# Patient Record
Sex: Female | Born: 1980 | Race: Black or African American | Hispanic: No | Marital: Single | Smoking: Former smoker
Health system: Southern US, Community
[De-identification: ages and names within clinical notes are randomized; demographics above are authoritative.]

## PROBLEM LIST (undated history)

## (undated) ENCOUNTER — Ambulatory Visit: Admission: EM

## (undated) DIAGNOSIS — E049 Nontoxic goiter, unspecified: Secondary | ICD-10-CM

## (undated) DIAGNOSIS — M5481 Occipital neuralgia: Secondary | ICD-10-CM

## (undated) DIAGNOSIS — R519 Headache, unspecified: Secondary | ICD-10-CM

## (undated) DIAGNOSIS — D179 Benign lipomatous neoplasm, unspecified: Secondary | ICD-10-CM

## (undated) DIAGNOSIS — R51 Headache: Secondary | ICD-10-CM

## (undated) DIAGNOSIS — M797 Fibromyalgia: Secondary | ICD-10-CM

## (undated) DIAGNOSIS — G935 Compression of brain: Secondary | ICD-10-CM

## (undated) DIAGNOSIS — D649 Anemia, unspecified: Secondary | ICD-10-CM

## (undated) DIAGNOSIS — N879 Dysplasia of cervix uteri, unspecified: Secondary | ICD-10-CM

## (undated) HISTORY — PX: THYROIDECTOMY: SHX17

---

## 2006-08-28 ENCOUNTER — Emergency Department: Payer: Self-pay | Admitting: Emergency Medicine

## 2006-10-08 LAB — HM PAP SMEAR: HM Pap smear: NEGATIVE

## 2007-12-24 ENCOUNTER — Ambulatory Visit: Payer: Self-pay | Admitting: Family Medicine

## 2008-02-09 ENCOUNTER — Observation Stay: Payer: Self-pay | Admitting: Certified Nurse Midwife

## 2008-03-27 ENCOUNTER — Observation Stay: Payer: Self-pay | Admitting: Obstetrics and Gynecology

## 2008-04-23 ENCOUNTER — Observation Stay: Payer: Self-pay

## 2008-05-10 ENCOUNTER — Emergency Department: Payer: Self-pay | Admitting: Emergency Medicine

## 2008-05-10 ENCOUNTER — Other Ambulatory Visit: Payer: Self-pay

## 2008-05-18 ENCOUNTER — Observation Stay: Payer: Self-pay | Admitting: Obstetrics and Gynecology

## 2008-05-29 ENCOUNTER — Observation Stay: Payer: Self-pay

## 2008-05-31 ENCOUNTER — Inpatient Hospital Stay: Payer: Self-pay | Admitting: Obstetrics and Gynecology

## 2008-05-31 ENCOUNTER — Observation Stay: Payer: Self-pay | Admitting: Certified Nurse Midwife

## 2008-08-22 ENCOUNTER — Emergency Department: Payer: Self-pay | Admitting: Internal Medicine

## 2008-09-27 ENCOUNTER — Emergency Department: Payer: Self-pay | Admitting: Emergency Medicine

## 2009-08-09 ENCOUNTER — Emergency Department: Payer: Self-pay | Admitting: Unknown Physician Specialty

## 2009-08-17 ENCOUNTER — Ambulatory Visit: Payer: Self-pay | Admitting: Certified Nurse Midwife

## 2009-09-04 ENCOUNTER — Ambulatory Visit: Payer: Self-pay | Admitting: Certified Nurse Midwife

## 2009-09-16 ENCOUNTER — Emergency Department: Payer: Self-pay | Admitting: Emergency Medicine

## 2009-10-22 ENCOUNTER — Emergency Department: Payer: Self-pay | Admitting: Emergency Medicine

## 2009-10-25 ENCOUNTER — Ambulatory Visit: Payer: Self-pay | Admitting: Family Medicine

## 2010-07-03 ENCOUNTER — Ambulatory Visit: Payer: Self-pay | Admitting: Internal Medicine

## 2010-09-27 ENCOUNTER — Ambulatory Visit: Payer: Self-pay | Admitting: Family Medicine

## 2010-11-03 ENCOUNTER — Emergency Department (HOSPITAL_COMMUNITY)
Admission: EM | Admit: 2010-11-03 | Discharge: 2010-11-03 | Disposition: A | Discharge status: Home / Self Care | Payer: Medicaid Other | Attending: Emergency Medicine | Admitting: Emergency Medicine

## 2010-11-03 DIAGNOSIS — N898 Other specified noninflammatory disorders of vagina: Secondary | ICD-10-CM | POA: Insufficient documentation

## 2010-11-03 DIAGNOSIS — J45909 Unspecified asthma, uncomplicated: Secondary | ICD-10-CM | POA: Insufficient documentation

## 2010-11-03 DIAGNOSIS — G935 Compression of brain: Secondary | ICD-10-CM | POA: Insufficient documentation

## 2010-11-03 DIAGNOSIS — R51 Headache: Secondary | ICD-10-CM | POA: Insufficient documentation

## 2010-11-03 LAB — WET PREP, GENITAL
Clue Cells Wet Prep HPF POC: NONE SEEN
Trich, Wet Prep: NONE SEEN

## 2010-11-03 LAB — URINALYSIS, ROUTINE W REFLEX MICROSCOPIC
Glucose, UA: NEGATIVE mg/dL
Ketones, ur: NEGATIVE mg/dL
Protein, ur: 30 mg/dL — AB
Urobilinogen, UA: 1 mg/dL (ref 0.0–1.0)

## 2010-11-03 LAB — URINE MICROSCOPIC-ADD ON

## 2010-11-08 ENCOUNTER — Ambulatory Visit: Payer: Self-pay | Admitting: Family Medicine

## 2010-11-15 ENCOUNTER — Emergency Department (HOSPITAL_COMMUNITY)
Admission: EM | Admit: 2010-11-15 | Discharge: 2010-11-16 | Disposition: A | Discharge status: Home / Self Care | Payer: Medicaid Other | Attending: Emergency Medicine | Admitting: Emergency Medicine

## 2010-11-15 DIAGNOSIS — R51 Headache: Secondary | ICD-10-CM | POA: Insufficient documentation

## 2010-11-15 DIAGNOSIS — IMO0001 Reserved for inherently not codable concepts without codable children: Secondary | ICD-10-CM | POA: Insufficient documentation

## 2010-11-15 DIAGNOSIS — R5381 Other malaise: Secondary | ICD-10-CM | POA: Insufficient documentation

## 2010-11-15 DIAGNOSIS — N390 Urinary tract infection, site not specified: Secondary | ICD-10-CM | POA: Insufficient documentation

## 2010-11-15 DIAGNOSIS — J45909 Unspecified asthma, uncomplicated: Secondary | ICD-10-CM | POA: Insufficient documentation

## 2010-11-16 LAB — BASIC METABOLIC PANEL
Chloride: 104 mEq/L (ref 96–112)
Creatinine, Ser: 0.88 mg/dL (ref 0.4–1.2)
GFR calc Af Amer: >60 mL/min (ref >60)
Potassium: 4 mEq/L (ref 3.5–5.1)

## 2010-11-16 LAB — CBC
HCT: 35.3 % — ABNORMAL LOW (ref 36.0–46.0)
MCH: 31.9 pg (ref 26.0–34.0)
MCHC: 33.1 g/dL (ref 30.0–36.0)
MCV: 96.2 fL (ref 78.0–100.0)
RDW: 13.2 % (ref 11.5–15.5)

## 2010-11-16 LAB — URINE MICROSCOPIC-ADD ON

## 2010-11-16 LAB — URINALYSIS, ROUTINE W REFLEX MICROSCOPIC
Bilirubin Urine: NEGATIVE
Hgb urine dipstick: NEGATIVE
Protein, ur: NEGATIVE mg/dL
Urobilinogen, UA: 0.2 mg/dL (ref 0.0–1.0)

## 2010-11-16 LAB — DIFFERENTIAL
Eosinophils Relative: 1 % (ref 0–5)
Lymphocytes Relative: 46 % (ref 12–46)
Lymphs Abs: 2.7 10*3/uL (ref 0.7–4.0)
Monocytes Absolute: 0.4 10*3/uL (ref 0.1–1.0)

## 2010-11-16 LAB — CK: Total CK: 58 U/L (ref 7–177)

## 2010-12-17 ENCOUNTER — Ambulatory Visit: Payer: Self-pay | Admitting: Family Medicine

## 2010-12-28 ENCOUNTER — Emergency Department: Payer: Self-pay | Admitting: Unknown Physician Specialty

## 2011-02-15 ENCOUNTER — Ambulatory Visit: Payer: Self-pay | Admitting: Family Medicine

## 2011-09-26 ENCOUNTER — Ambulatory Visit: Payer: Self-pay | Admitting: Family Medicine

## 2012-02-26 ENCOUNTER — Observation Stay: Payer: Self-pay

## 2012-03-05 ENCOUNTER — Observation Stay: Payer: Self-pay

## 2012-03-09 ENCOUNTER — Observation Stay: Payer: Self-pay | Admitting: Obstetrics and Gynecology

## 2012-03-12 ENCOUNTER — Encounter: Payer: Self-pay | Admitting: Maternal and Fetal Medicine

## 2012-03-13 ENCOUNTER — Inpatient Hospital Stay: Payer: Self-pay | Admitting: Obstetrics and Gynecology

## 2012-03-13 LAB — CBC WITH DIFFERENTIAL/PLATELET
Basophil %: 0.2 %
Eosinophil #: 0.1 10*3/uL (ref 0.0–0.7)
Eosinophil %: 1 %
HCT: 37.3 % (ref 35.0–47.0)
HGB: 12.4 g/dL (ref 12.0–16.0)
Lymphocyte %: 15.6 %
MCV: 101 fL — ABNORMAL HIGH (ref 80–100)
Monocyte #: 0.7 x10 3/mm (ref 0.2–0.9)
Monocyte %: 6.7 %
Neutrophil #: 7.5 10*3/uL — ABNORMAL HIGH (ref 1.4–6.5)
RBC: 3.7 10*6/uL — ABNORMAL LOW (ref 3.80–5.20)
WBC: 9.8 10*3/uL (ref 3.6–11.0)

## 2012-03-13 LAB — TSH: Thyroid Stimulating Horm: 2.8 u[IU]/mL

## 2012-03-17 LAB — PATHOLOGY REPORT

## 2012-11-24 ENCOUNTER — Ambulatory Visit: Payer: Self-pay | Admitting: Family Medicine

## 2013-01-04 ENCOUNTER — Ambulatory Visit: Payer: Self-pay | Admitting: Family Medicine

## 2013-01-04 LAB — HCG, QUANTITATIVE, PREGNANCY: Beta Hcg, Quant.: <1 m[IU]/mL — ABNORMAL LOW

## 2013-01-15 ENCOUNTER — Telehealth: Payer: Self-pay | Admitting: Diagnostic Neuroimaging

## 2013-01-18 NOTE — Telephone Encounter (Signed)
Pt says she is in pain. All over body, including chest. 9 on pain scale. Says she was told by pain specialist may be nerve related pain. Does not want to go to ER. Having vision changes also. Will p/u pain meds from pain clinic on 01/29/13. Has eye exam appt on 02/02/13. Will follow up with pain clinic in office on 02/25/13 to see if pain meds are working. Would like to know if she should go forward w/ LP as suggested in last OV.

## 2013-01-27 NOTE — Telephone Encounter (Signed)
I called pt and relayed that per Dr. Richrd Humbles note to have pain clinic appt and eye exam appt first and the he will review records.  Told her to have them fax results to [place].   She said she would.

## 2013-01-27 NOTE — Telephone Encounter (Signed)
No LP. Should go to pain clinic, eye clinic first, and then I will review results. -VRP

## 2013-06-03 ENCOUNTER — Ambulatory Visit: Payer: Self-pay | Admitting: Family Medicine

## 2013-07-14 ENCOUNTER — Ambulatory Visit: Payer: Self-pay | Admitting: Family Medicine

## 2013-09-13 ENCOUNTER — Ambulatory Visit: Payer: Self-pay | Admitting: Gastroenterology

## 2014-05-10 ENCOUNTER — Ambulatory Visit: Payer: Self-pay | Admitting: Neurology

## 2014-06-07 DIAGNOSIS — G935 Compression of brain: Secondary | ICD-10-CM | POA: Insufficient documentation

## 2014-09-20 ENCOUNTER — Ambulatory Visit: Payer: Self-pay | Admitting: Family Medicine

## 2014-10-06 ENCOUNTER — Ambulatory Visit: Payer: Self-pay | Admitting: Family Medicine

## 2014-10-06 HISTORY — PX: BREAST BIOPSY: SHX20

## 2014-12-20 NOTE — Op Note (Signed)
PATIENT NAME:  Kelli Richard, Kelli Richard MR#:  [id number] DATE OF BIRTH:  February 17, 1981  DATE OF PROCEDURE:  03/13/2012  PREOPERATIVE DIAGNOSES: 1. Postdates pregnancy. 2. Oligohydramnios. 3. History of Chiari malformation and occipital lipoma. 4. Worsening headaches. 5. Elective permanent sterilization.   POSTOPERATIVE DIAGNOSES: 1. Postdates pregnancy. 2. Oligohydramnios. 3. History of Chiari malformation and occipital lipoma. 4. Worsening headaches. 5. Elective permanent sterilization.   PROCEDURE:  1. Primary low transverse cesarean section.  2. Bilateral tubal ligation, [place].  3. On-Q pump placement.   SURGEON: Boykin Nearing, M.D.  FIRST ASSISTANT:  A. Harriet Pho, CNM   ANESTHESIA:  Spinal.  INDICATION: This is a 34 year old gravida 3, para 2 patient with a history of Arnold-Chiari malformation and occipital lipoma. The patient had previously been worked up by neurology but no recent work-up. The patient has increasing headaches. The patient is 41 weeks and at risk for complications if she is allowed to labor based on her central nervous system symptoms and prior anatomic history.  I spoke to the patient regarding a primary low transverse cesarean section and tubal ligation. The patient agrees to the procedure. The patient reconfirms desire for permanent sterilization.   PROCEDURE: After adequate spinal anesthesia, the patient was placed in the dorsal supine position with a hip roll under the right side. The patient's abdomen was prepped and draped in normal sterile fashion. A Pfannenstiel incision was made two fingerbreadths above the symphysis pubis. Sharp dissection was used to identify the fascia. The fascia was opened in the midline and opened in a transverse fashion. The superior aspect of the fascia was grasped with Kocher clamps and the recti muscles dissected free. The inferior aspect of the fascia was grasped with Kocher clamps. Pyramidalis muscle was dissected free.  Entry into the peritoneal cavity was accomplished sharply. The vesicouterine peritoneal fold was identified and opened and a bladder flap was created, and the bladder was reflected inferiorly. A low transverse uterine incision was made. Upon entry into the endometrial cavity, clear fluid resulted. The fetal head was brought to the incision, vacuum applied to the occiput. One gentle pull allowed for delivery of the head. Shoulders and body were delivered without difficulty. The placenta was manually delivered and the uterus was exteriorized. The endometrial cavity was wiped clean with laparotomy tape. A ring forceps was used to open the cervix and this was passed off the operative field. The uterine incision was closed with 1 chromic suture in a running locking fashion with good hemostasis and good approximation of tissue. Attention was then directed to the patient's right fallopian tube which was grasped at the midportion of the fallopian tube. Two separate 0 plain gut sutures were applied and a 1.5-cm portion of the fallopian tube removed. A similar procedure was repeated on the patient's left fallopian tube. After placing 2-0 plain gut sutures, a 1.5-cm  portion of the fallopian tube was removed. Good hemostasis was noted. Ovaries appeared normal. The posterior cul-de-sac appeared normal. The uterus was placed back into the abdominal cavity. The paracolic gutters were wiped clean with a laparotomy tape. Good hemostasis was noted at the uterine incision. Interceed was placed on the incision in a T-shaped fashion. The On-Q pump system was placed infraumbilically and the catheters were placed subfascially. The fascia was closed over this with 0 Vicryl suture. The fascia was reapproximated in normal fashion. The subcutaneous tissues were irrigated and bovied for hemostasis and the skin was reapproximated with staples. The On-Q pump catheters were secured in standard  fashion and were infused with 5 mL of 0.5% Marcaine.  The patient did receive 2 grams of  IV Ancef prior to commencement of the case. There were no complications. The patient tolerated the procedure well. Estimated blood loss was 600 mL. Intraoperative fluids were 900 mL. The patient was taken to the recovery room in good condition.    ____________________________ Boykin Nearing, MD tjs:bjt D: 03/13/2012 16:12:37 ET T: 03/13/2012 16:37:24 ET JOB#: [id number]  Boykin Nearing MD ELECTRONICALLY SIGNED 03/16/2012 8:45

## 2014-12-20 NOTE — Discharge Summary (Signed)
PATIENT NAME:  Kelli Richard, Kelli Richard MR#:  [id number] DATE OF BIRTH:  10/31/1980  DATE OF ADMISSION:  03/13/2012 DATE OF DISCHARGE:  03/16/2012  PRINCIPAL PROCEDURE: Primary low transverse Cesarean section.   HOSPITAL COURSE: The patient underwent the above procedure for history of Chiari malformation and headaches in pregnancy and documented intracranial lesion. Also, the patient was diagnosed with oligohydramnios. The patient tolerated the procedure well. Postop day #1 hematocrit was 31.9%. She was discharged to home in good condition and will follow-up with Dr. Ouida Sills in two weeks for wound care or before if she has wound drainage, fever, nausea, or vomiting.   ____________________________ Boykin Nearing, MD tjs:drc D: 03/23/2012 09:19:33 ET T: 03/23/2012 14:46:47 ET JOB#: [id number]  cc: Boykin Nearing, MD, <Dictator> Boykin Nearing MD ELECTRONICALLY SIGNED 03/26/2012 10:43

## 2014-12-25 NOTE — Consult Note (Signed)
Referral Information:   Reason for Referral 41 0/7 weeks -- scheduled for labor induction tomorrow. Patient with severe headaches; she is known to have a Chiari Malformation and an occipital lipoma.   Home Medications: Medication Instructions Status  Prenatal Multivitamins oral tablet 1 tab(s) orally once a day Active  Carafate 1 g/10 mL oral suspension 2 teaspoonfulls before each meal,for Indigestion, Heartburn Active  omeprazole 20 mg oral delayed release tablet 2 tab(s) orally 2 times a day Active  fluticasone 50 mcg/inh nasal spray 1 spray(s) nasal once a day Active  Deep Sea Nasal 0.65% nasal spray 2 spray(s) nasal 4 times a day Active  ferrous sulfate 325 mg (65 mg elemental iron) oral tablet 1 tab(s) orally 2 times a day Active   Allergies:   No Known Allergies:   Vital Signs/Notes:  Nursing Vital Signs: **Vital Signs.:   11-Jul-13 10:35   Vital Signs Type Routine   Temperature Temperature (F) 97.8   Celsius 36.5   Temperature Source oral   Pulse Pulse 91   Pulse source if not from Vital Sign Device dinamap   Respirations Respirations 16   Systolic BP Systolic BP 010   Diastolic BP (mmHg) Diastolic BP (mmHg) 66   Mean BP 84   BP Source  if not from Vital Sign Device dinamap   Impression/Recommendations:   Impression When speaking with the patient, I learned that she cannot describe with accuracy much about her disease/lesion. She does know that sometime in 2012 she had a work up for her headaches that showed the Chiari malformation and the lipoma. She does not know what if any medicine or treatment was recommended. She does not know if she was given any precautions. I have no access to her medical records concerning this.    Recommendations Before delivery is attempted obtain head MRI and get neurology consultation.     Total Time Spent with Patient 15 minutes    >50% of visit spent in couseling/coordination of care yes    Office Use Only 99241  Level 1 (7min) NEW  office consult prob focused   Coding Description: MATERNAL CONDITIONS/HISTORY INDICATION(S).   OTHER: Chiari malformation and occiptal lipoma.  Electronic Signatures: Sande Rives (MD)  (Signed 11-Jul-13 11:10)  Authored: Referral, Home Medications, Allergies, Vital Signs/Notes, Impression, Billing, Coding Description   Last Updated: 11-Jul-13 11:10 by Sande Rives (MD)

## 2014-12-26 LAB — SURGICAL PATHOLOGY

## 2015-06-07 ENCOUNTER — Emergency Department
Admission: EM | Admit: 2015-06-07 | Discharge: 2015-06-07 | Disposition: A | Discharge status: Home / Self Care | Payer: Medicaid Other | Attending: Emergency Medicine | Admitting: Emergency Medicine

## 2015-06-07 ENCOUNTER — Encounter: Payer: Self-pay | Admitting: Emergency Medicine

## 2015-06-07 DIAGNOSIS — S199XXA Unspecified injury of neck, initial encounter: Secondary | ICD-10-CM | POA: Diagnosis present

## 2015-06-07 DIAGNOSIS — S39012A Strain of muscle, fascia and tendon of lower back, initial encounter: Secondary | ICD-10-CM | POA: Insufficient documentation

## 2015-06-07 DIAGNOSIS — Y998 Other external cause status: Secondary | ICD-10-CM | POA: Insufficient documentation

## 2015-06-07 DIAGNOSIS — Y9389 Activity, other specified: Secondary | ICD-10-CM | POA: Insufficient documentation

## 2015-06-07 DIAGNOSIS — S161XXA Strain of muscle, fascia and tendon at neck level, initial encounter: Secondary | ICD-10-CM | POA: Diagnosis not present

## 2015-06-07 DIAGNOSIS — Y92481 Parking lot as the place of occurrence of the external cause: Secondary | ICD-10-CM | POA: Diagnosis not present

## 2015-06-07 MED ORDER — CYCLOBENZAPRINE HCL 10 MG PO TABS: 10.0000 mg | ORAL_TABLET | Freq: Three times a day (TID) | ORAL | Status: DC | PRN

## 2015-06-07 MED ORDER — OXYCODONE-ACETAMINOPHEN 7.5-325 MG PO TABS: 1.0000 | ORAL_TABLET | Freq: Four times a day (QID) | ORAL | Status: DC | PRN

## 2015-06-07 NOTE — ED Notes (Signed)
AAOx3.  Skin warm and dry.  Moving all extremities equally and strong. Ambulates with easy and steady gait.  Posture relaxed.
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
## 2015-09-14 ENCOUNTER — Other Ambulatory Visit: Payer: Self-pay | Admitting: Family Medicine

## 2015-09-14 ENCOUNTER — Ambulatory Visit
Admission: RE | Admit: 2015-09-14 | Discharge: 2015-09-14 | Disposition: A | Discharge status: Home / Self Care | Payer: Medicaid Other | Source: Ambulatory Visit | Attending: Family Medicine | Admitting: Family Medicine

## 2015-09-14 DIAGNOSIS — M545 Low back pain: Secondary | ICD-10-CM | POA: Insufficient documentation

## 2015-09-20 ENCOUNTER — Other Ambulatory Visit: Payer: Self-pay | Admitting: Family Medicine

## 2015-09-20 DIAGNOSIS — N63 Unspecified lump in unspecified breast: Secondary | ICD-10-CM

## 2015-10-03 ENCOUNTER — Ambulatory Visit: Payer: No Typology Code available for payment source

## 2015-10-03 ENCOUNTER — Other Ambulatory Visit: Payer: Medicaid Other

## 2015-10-12 ENCOUNTER — Other Ambulatory Visit: Payer: Medicaid Other

## 2015-10-12 ENCOUNTER — Ambulatory Visit: Payer: No Typology Code available for payment source

## 2015-10-19 ENCOUNTER — Ambulatory Visit: Payer: No Typology Code available for payment source

## 2015-10-19 ENCOUNTER — Other Ambulatory Visit: Payer: Medicaid Other

## 2015-10-24 ENCOUNTER — Ambulatory Visit
Admission: RE | Admit: 2015-10-24 | Discharge: 2015-10-24 | Disposition: A | Discharge status: Home / Self Care | Payer: Medicaid Other | Source: Ambulatory Visit | Attending: Family Medicine | Admitting: Family Medicine

## 2015-10-24 ENCOUNTER — Other Ambulatory Visit: Payer: Self-pay | Admitting: Family Medicine

## 2015-10-24 DIAGNOSIS — N63 Unspecified lump in unspecified breast: Secondary | ICD-10-CM

## 2015-10-26 ENCOUNTER — Other Ambulatory Visit: Payer: Self-pay | Admitting: Family Medicine

## 2015-10-26 DIAGNOSIS — N63 Unspecified lump in unspecified breast: Secondary | ICD-10-CM

## 2015-11-08 ENCOUNTER — Ambulatory Visit
Admission: RE | Admit: 2015-11-08 | Discharge: 2015-11-08 | Disposition: A | Discharge status: Home / Self Care | Payer: Medicaid Other | Source: Ambulatory Visit | Attending: Family Medicine | Admitting: Family Medicine

## 2015-11-08 ENCOUNTER — Ambulatory Visit: Payer: No Typology Code available for payment source

## 2015-11-08 DIAGNOSIS — N63 Unspecified lump in unspecified breast: Secondary | ICD-10-CM

## 2015-11-08 HISTORY — PX: BREAST BIOPSY: SHX20

## 2015-11-10 LAB — SURGICAL PATHOLOGY

## 2016-01-23 ENCOUNTER — Emergency Department
Admission: EM | Admit: 2016-01-23 | Discharge: 2016-01-23 | Disposition: A | Discharge status: Home / Self Care | Payer: Medicaid Other | Attending: Emergency Medicine | Admitting: Emergency Medicine

## 2016-01-23 ENCOUNTER — Encounter: Payer: Self-pay | Admitting: Emergency Medicine

## 2016-01-23 DIAGNOSIS — M797 Fibromyalgia: Secondary | ICD-10-CM | POA: Insufficient documentation

## 2016-01-23 DIAGNOSIS — M79604 Pain in right leg: Secondary | ICD-10-CM | POA: Insufficient documentation

## 2016-01-23 DIAGNOSIS — M79605 Pain in left leg: Secondary | ICD-10-CM | POA: Diagnosis present

## 2016-01-23 HISTORY — DX: Anemia, unspecified: D64.9

## 2016-01-23 HISTORY — DX: Fibromyalgia: M79.7

## 2016-01-23 MED ORDER — METHYLPREDNISOLONE 4 MG PO TBPK: ORAL_TABLET | ORAL | Status: DC

## 2016-01-23 MED ORDER — TRAMADOL HCL 50 MG PO TABS: 50.0000 mg | ORAL_TABLET | Freq: Four times a day (QID) | ORAL | Status: AC | PRN
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
## 2016-01-23 NOTE — ED Notes (Addendum)
Pt reports dx of fibromyalgia and is having pain all over but worse in her back/arms/legs since yesterday - pt reports that she has been working full time and she thinks that working is causing the pain to increase

## 2016-01-23 NOTE — ED Provider Notes (Signed)
[facility] Emergency Department Provider Note   ____________________________________________  Time seen: Approximately 9:41 PM  I have reviewed the triage vital signs and the nursing notes.   HISTORY  Chief Complaint Back Pain and Leg Pain    HPI Kelli Richard is a 35 y.o. female patient complain pain secondary to her fibromyalgia. Patient states she's been working more usual and believes this is increase in her pain. Patient stated the pain is diffuse but worse in her lower extremities. Patient states she is has not been evaluated by pain management and requests a consult. Patient states she is seen by orthopedics with no definitive relief. Patient rating the pain as a 10 over 10. No palliative measures taken for this complaint.   Past Medical History  Diagnosis Date  . Fibromyalgia   . Anemia     There are no active problems to display for this patient.   Past Surgical History  Procedure Laterality Date  . Breast biopsy Right 10/06/2014    [place] core biopsy  . Thyroidectomy      Current Outpatient Rx  Name  Route  Sig  Dispense  Refill  . cyclobenzaprine (FLEXERIL) 10 MG tablet   Oral   Take 1 tablet (10 mg total) by mouth every 8 (eight) hours as needed for muscle spasms.   15 tablet   0   . methylPREDNISolone (MEDROL DOSEPAK) 4 MG TBPK tablet      Take Tapered dose as directed   21 tablet   0   . oxyCODONE-acetaminophen (PERCOCET) 7.5-325 MG tablet   Oral   Take 1 tablet by mouth every 6 (six) hours as needed for severe pain.   12 tablet   0   . traMADol (ULTRAM) 50 MG tablet   Oral   Take 1 tablet (50 mg total) by mouth every 6 (six) hours as needed.   20 tablet   0     Allergies Review of patient's allergies indicates no known allergies.  History reviewed. No pertinent family history.  Social History Social History  Substance Use Topics  . Smoking status: Never Smoker   . Smokeless tobacco: None  . Alcohol  Use: No    Review of Systems Constitutional: No fever/chills Eyes: No visual changes. ENT: No sore throat. Cardiovascular: Denies chest pain. Respiratory: Denies shortness of breath. Gastrointestinal: No abdominal pain.  No nausea, no vomiting.  No diarrhea.  No constipation. Genitourinary: Negative for dysuria. Musculoskeletal: Negative for back pain. Skin: Negative for rash. Neurological: Negative for headaches, focal weakness or numbness. Hematological/Lymphatic:Anemia ____________________________________________   PHYSICAL EXAM:  VITAL SIGNS: ED Triage Vitals  Enc Vitals Group     BP 01/23/16 2051 116/65 mmHg     Pulse --      Resp 01/23/16 2051 18     Temp 01/23/16 2051 98.2 F (36.8 C)     Temp Source 01/23/16 2051 Oral     SpO2 01/23/16 2051 99 %     Weight 01/23/16 2051 198 lb (89.812 kg)     Height 01/23/16 2051 5\' 6"  (1.676 m)     Head Cir --      Peak Flow --      Pain Score 01/23/16 2053 10     Pain Loc --      Pain Edu? --      Excl. in [place]? --     Constitutional: Alert and oriented. Well appearing and in no acute distress. Eyes: Conjunctivae are normal. PERRL.  EOMI. Head: Atraumatic. Nose: No congestion/rhinnorhea. Mouth/Throat: Mucous membranes are moist.  Oropharynx non-erythematous. Neck: No stridor. No cervical spine tenderness to palpation. Cardiovascular: Normal rate, regular rhythm. Grossly normal heart sounds.  Good peripheral circulation. Respiratory: Normal respiratory effort.  No retractions. Lungs CTAB. Gastrointestinal: Soft and nontender. No distention. No abdominal bruits. No CVA tenderness. Musculoskeletal: Bilateral  upper and lower  extremity guarding with palpation. Neurologic:  Normal speech and language. No gross focal neurologic deficits are appreciated. No gait instability. Skin:  Skin is warm, dry and intact. No rash noted. Psychiatric: Mood and affect are normal. Speech and behavior are  normal.  ____________________________________________   LABS (all labs ordered are listed, but only abnormal results are displayed)  Labs Reviewed - No data to display ____________________________________________  EKG   ____________________________________________  RADIOLOGY    PROCEDURES  Procedure(s) performed: None  Critical Care performed: No  ____________________________________________   INITIAL IMPRESSION / ASSESSMENT AND PLAN / ED COURSE  Pertinent labs & imaging results that were available during my care of the patient were reviewed by me and considered in my medical decision making (see chart for details).  Myalgia and arthralgia. Patient given discharge Instructions. Patient advised to follow-up with the pain management clinic for definitive evaluation and treatment. Patient given a work note for one day. Patient given a prescription for prednisone and tramadol. ____________________________________________   FINAL CLINICAL IMPRESSION(S) / ED DIAGNOSES  Final diagnoses:  Fibromyalgia affecting multiple sites      NEW MEDICATIONS STARTED DURING THIS VISIT:  New Prescriptions   METHYLPREDNISOLONE (MEDROL DOSEPAK) 4 MG TBPK TABLET    Take Tapered dose as directed   TRAMADOL (ULTRAM) 50 MG TABLET    Take 1 tablet (50 mg total) by mouth every 6 (six) hours as needed.     Note:  This document was prepared using Dragon voice recognition software and may include unintentional dictation errors.    Sable Feil, PA-C 01/23/16 2153  Harvest Dark, MD 01/23/16 [phone]

## 2016-01-23 NOTE — Discharge Instructions (Signed)
Myofascial Pain Syndrome and Fibromyalgia  Myofascial pain syndrome and fibromyalgia are both pain disorders. This pain may be felt mainly in your muscles.   · Myofascial pain syndrome:    Always has trigger points or tender points in the muscle that will cause pain when pressed. The pain may come and go.    Usually affects your neck, upper back, and shoulder areas. The pain often radiates into your arms and hands.  · Fibromyalgia:    Has muscle pains and tenderness that come and go.    Is often associated with fatigue and sleep disturbances.    Has trigger points.    Tends to be long-lasting (chronic), but is not life-threatening.  Fibromyalgia and myofascial pain are not the same. However, they often occur together. If you have both conditions, each can make the other worse. Both are common and can cause enough pain and fatigue to make day-to-day activities difficult.   CAUSES   The exact causes of fibromyalgia and myofascial pain are not known. People with certain gene types may be more likely to develop fibromyalgia. Some factors can be triggers for both conditions, such as:   · Spine disorders.  · Arthritis.  · Severe injury (trauma) and other physical stressors.  · Being under a lot of stress.  · A medical illness.  SIGNS AND SYMPTOMS   Fibromyalgia  The main symptom of fibromyalgia is widespread pain and tenderness in your muscles. This can vary over time. Pain is sometimes described as stabbing, shooting, or burning. You may have tingling or numbness, too. You may also have sleep problems and fatigue. You may wake up feeling tired and groggy (fibro fog). Other symptoms may include:   · Bowel and bladder problems.  · Headaches.  · Visual problems.  · Problems with odors and noises.  · Depression or mood changes.  · Painful menstrual periods (dysmenorrhea).  · Dry skin or eyes.  Myofascial pain syndrome  Symptoms of myofascial pain syndrome include:   · Tight, ropy bands of muscle.    · Uncomfortable  sensations in muscular areas, such as:    Aching.    Cramping.    Burning.    Numbness.    Tingling.      Muscle weakness.  · Trouble moving certain muscles freely (range of motion).  DIAGNOSIS   There are no specific tests to diagnose fibromyalgia or myofascial pain syndrome. Both can be hard to diagnose because their symptoms are common in many other conditions. Your health care provider may suspect one or both of these conditions based on your symptoms and medical history. Your health care provider will also do a physical exam.   The key to diagnosing fibromyalgia is having pain, fatigue, and other symptoms for more than three months that cannot be explained by another condition.   The key to diagnosing myofascial pain syndrome is finding trigger points in muscles that are tender and cause pain elsewhere in your body (referred pain).  TREATMENT   Treating fibromyalgia and myofascial pain often requires a team of health care providers. This usually starts with your primary provider and a physical therapist. You may also find it helpful to work with alternative health care providers, such as massage therapists or acupuncturists.  Treatment for fibromyalgia may include medicines. This may include nonsteroidal anti-inflammatory drugs (NSAIDs), along with other medicines.   Treatment for myofascial pain may also include:  · NSAIDs.  · Cooling and stretching of muscles.  · Trigger point injections.  ·   Sound wave (ultrasound) treatments to stimulate muscles.  HOME CARE INSTRUCTIONS   · Take medicines only as directed by your health care provider.  · Exercise as directed by your health care provider or physical therapist.  · Try to avoid stressful situations.  · Practice relaxation techniques to control your stress. You may want to try:    Biofeedback.    Visual imagery.    Hypnosis.    Muscle relaxation.    Yoga.    Meditation.  · Talk to your health care provider about alternative treatments, such as acupuncture or  massage treatment.  · Maintain a healthy lifestyle. This includes eating a healthy diet and getting enough sleep.  · Consider joining a support group.  · Do not do activities that stress or strain your muscles. That includes repetitive motions and heavy lifting.  SEEK MEDICAL CARE IF:   · You have new symptoms.  · Your symptoms get worse.  · You have side effects from your medicines.  · You have trouble sleeping.  · Your condition is causing depression or anxiety.  FOR MORE INFORMATION   · National Fibromyalgia Association: http://www.fmaware.orgwww.fmaware.org  · Arthritis Foundation: http://www.arthritis.orgwww.arthritis.org  · American Chronic Pain Association: http://www.theacpa.org/condition/myofascial-painwww.theacpa.org/condition/myofascial-pain     This information is not intended to replace advice given to you by your health care provider. Make sure you discuss any questions you have with your health care provider.     Document Released: 08/19/2005 Document Revised: 09/09/2014 Document Reviewed: 05/25/2014  Elsevier Interactive Patient Education ©2016 [employer]

## 2016-09-03 ENCOUNTER — Encounter: Payer: Self-pay | Admitting: Medical Oncology

## 2016-09-03 ENCOUNTER — Emergency Department: Payer: Medicaid Other

## 2016-09-03 ENCOUNTER — Emergency Department
Admission: EM | Admit: 2016-09-03 | Discharge: 2016-09-03 | Disposition: A | Discharge status: Home / Self Care | Payer: Medicaid Other | Attending: Emergency Medicine | Admitting: Emergency Medicine

## 2016-09-03 DIAGNOSIS — R51 Headache: Secondary | ICD-10-CM | POA: Diagnosis present

## 2016-09-03 DIAGNOSIS — R110 Nausea: Secondary | ICD-10-CM | POA: Insufficient documentation

## 2016-09-03 DIAGNOSIS — R197 Diarrhea, unspecified: Secondary | ICD-10-CM | POA: Insufficient documentation

## 2016-09-03 DIAGNOSIS — R519 Headache, unspecified: Secondary | ICD-10-CM

## 2016-09-03 LAB — COMPREHENSIVE METABOLIC PANEL
ALT: 11 U/L — AB (ref 14–54)
ANION GAP: 4 — AB (ref 5–15)
AST: 19 U/L (ref 15–41)
Albumin: 4.1 g/dL (ref 3.5–5.0)
Alkaline Phosphatase: 61 U/L (ref 38–126)
BUN: 9 mg/dL (ref 6–20)
CHLORIDE: 106 mmol/L (ref 101–111)
CO2: 28 mmol/L (ref 22–32)
CREATININE: 0.86 mg/dL (ref 0.44–1.00)
Calcium: 9.1 mg/dL (ref 8.9–10.3)
Glucose, Bld: 86 mg/dL (ref 65–99)
POTASSIUM: 3.5 mmol/L (ref 3.5–5.1)
SODIUM: 138 mmol/L (ref 135–145)
Total Bilirubin: 0.6 mg/dL (ref 0.3–1.2)
Total Protein: 7.5 g/dL (ref 6.5–8.1)

## 2016-09-03 LAB — LIPASE, BLOOD: LIPASE: 24 U/L (ref 11–51)

## 2016-09-03 LAB — CBC
HCT: 35.4 % (ref 35.0–47.0)
Hemoglobin: 11.7 g/dL — ABNORMAL LOW (ref 12.0–16.0)
MCH: 29.7 pg (ref 26.0–34.0)
MCHC: 33 g/dL (ref 32.0–36.0)
MCV: 90 fL (ref 80.0–100.0)
Platelets: 243 10*3/uL (ref 150–440)
RBC: 3.94 MIL/uL (ref 3.80–5.20)
RDW: 17 % — AB (ref 11.5–14.5)
WBC: 5.8 10*3/uL (ref 3.6–11.0)

## 2016-09-03 MED ORDER — SODIUM CHLORIDE 0.9 % IV BOLUS (SEPSIS)
1000.0000 mL | Freq: Once | INTRAVENOUS | Status: AC
  Administered 2016-09-03: 1000 mL via INTRAVENOUS
  Filled 2016-09-03: qty 1000

## 2016-09-03 MED ORDER — KETOROLAC TROMETHAMINE 30 MG/ML IJ SOLN
30.0000 mg | Freq: Once | INTRAMUSCULAR | Status: AC
  Administered 2016-09-03: 30 mg via INTRAVENOUS
  Filled 2016-09-03: qty 1

## 2016-09-03 MED ORDER — LOPERAMIDE HCL 2 MG PO CAPS
4.0000 mg | ORAL_CAPSULE | Freq: Once | ORAL | Status: AC
  Administered 2016-09-03: 4 mg via ORAL
  Filled 2016-09-03 (×2): qty 2

## 2016-09-03 NOTE — ED Triage Notes (Signed)
Pt reports headache to back of head that began Thursday and nausea vomiting and diarrhea.

## 2016-09-03 NOTE — ED Notes (Signed)
Pt to ed with c/o headache that started Thursday night without relief.  Pt states pain has progressively gotten worse.  Pt alert and oriented, skin warm and dry.   Pt also reports diarrhea.

## 2016-09-03 NOTE — ED Provider Notes (Signed)
[facility] Emergency Department Provider Note  Time seen: 4:04 PM  I have reviewed the triage vital signs and the nursing notes.   HISTORY  Chief Complaint Headache and Diarrhea    HPI Kelli Richard is a 36 y.o. female with a past medical history of fibromyalgia who presents the emergency department with an occipital headache and diarrhea for the past 4 days. According to the patient for 4 days now she has had a moderate to severe occipital headache which she states comes and goes. Patient states a history of fibromyalgia and is not sure if the headache is due to her fibromyalgia or something else. Patient also states for the past 4 days she has been nauseated with intermittent episodes of diarrhea. She had one episode of diarrhea today. Denies any vomiting. Denies any fever. Denies any focal weakness or numbness. Denies any history of migraines in the past.Describes the headache as occipital, stabbing in nature.  Past Medical History:  Diagnosis Date  . Anemia   . Fibromyalgia     There are no active problems to display for this patient.   Past Surgical History:  Procedure Laterality Date  . BREAST BIOPSY Right 10/06/2014   [place] core biopsy  . THYROIDECTOMY      Prior to Admission medications   Medication Sig Start Date End Date Taking? Authorizing Provider  cyclobenzaprine (FLEXERIL) 10 MG tablet Take 1 tablet (10 mg total) by mouth every 8 (eight) hours as needed for muscle spasms. 06/07/15   Sable Feil, PA-C  methylPREDNISolone (MEDROL DOSEPAK) 4 MG TBPK tablet Take Tapered dose as directed 01/23/16   Sable Feil, PA-C  oxyCODONE-acetaminophen (PERCOCET) 7.5-325 MG tablet Take 1 tablet by mouth every 6 (six) hours as needed for severe pain. 06/07/15   Sable Feil, PA-C  traMADol (ULTRAM) 50 MG tablet Take 1 tablet (50 mg total) by mouth every 6 (six) hours as needed. 01/23/16 01/22/17  Sable Feil, PA-C    No Known Allergies  No family  history on file.  Social History Social History  Substance Use Topics  . Smoking status: Never Smoker  . Smokeless tobacco: Not on file  . Alcohol use No    Review of Systems Constitutional: Negative for fever. Cardiovascular: Negative for chest pain. Respiratory: Negative for shortness of breath. Gastrointestinal: Negative for abdominal pain. Positive for nausea. Negative for vomiting. Positive for diarrhea. Genitourinary: Negative for dysuria. Neurological: Moderate occipital headache without weakness or numbness. 10-point ROS otherwise negative.  ____________________________________________   PHYSICAL EXAM:  VITAL SIGNS: ED Triage Vitals  Enc Vitals Group     BP 09/03/16 1343 (!) 147/102     Pulse Rate 09/03/16 1343 81     Resp 09/03/16 1343 18     Temp 09/03/16 1343 98.5 F (36.9 C)     Temp Source 09/03/16 1343 Oral     SpO2 09/03/16 1343 100 %     Weight 09/03/16 1344 181 lb (82.1 kg)     Height 09/03/16 1344 5\' 8"  (1.727 m)     Head Circumference --      Peak Flow --      Pain Score 09/03/16 1344 10     Pain Loc --      Pain Edu? --      Excl. in [place]? --     Constitutional: Alert and oriented.  Eyes: Normal exam ENT   Head: Normocephalic and atraumatic.   Mouth/Throat: Mucous membranes are moist. Cardiovascular: Normal  rate, regular rhythm. No murmur Respiratory: Normal respiratory effort without tachypnea nor retractions. Breath sounds are clear Gastrointestinal: Soft and nontender. No distention. Musculoskeletal: Nontender with normal range of motion in all extremities.  Neurologic:  Normal speech and language. No gross focal neurologic deficits. Equal grips and bilaterally. No pronator drift. No cranial nerve deficits. Skin:  Skin is warm, dry and intact.  Psychiatric: Mood and affect are normal. Speech and behavior are normal.   ____________________________________________     RADIOLOGY  CT head  negative  ____________________________________________   INITIAL IMPRESSION / ASSESSMENT AND PLAN / ED COURSE  Pertinent labs & imaging results that were available during my care of the patient were reviewed by me and considered in my medical decision making (see chart for details).  Patient presents the emergency department with 4 days of headache, also with 4 days of intermittent diarrhea. Patient states she tried to get him up her primary care doctor but was unsuccessful so she came to the emergency department for evaluation. States moderate occipital headache currently, states it comes and goes stabbing in nature. We'll obtain a CT scan given the duration of the headache. Labs are largely within normal limits, physical exam including neurological examination is largely within normal limits. Patient states a history of a Chiari malformation in the past. We will treat with Toradol and IV fluids while awaiting CT results.  CT negative. Patient states the headache is nearly gone at this time, much improved. We will discharge the patient home. ____________________________________________   FINAL CLINICAL IMPRESSION(S) / ED DIAGNOSES  Headache Diarrhea    Harvest Dark, MD 09/03/16 1811

## 2016-09-10 DIAGNOSIS — Z62810 Personal history of physical and sexual abuse in childhood: Secondary | ICD-10-CM | POA: Insufficient documentation

## 2016-11-12 ENCOUNTER — Other Ambulatory Visit: Payer: Self-pay | Admitting: Family Medicine

## 2016-11-12 DIAGNOSIS — R102 Pelvic and perineal pain: Secondary | ICD-10-CM

## 2016-11-15 ENCOUNTER — Ambulatory Visit: Payer: Medicaid Other

## 2016-11-18 ENCOUNTER — Ambulatory Visit
Admission: RE | Admit: 2016-11-18 | Discharge: 2016-11-18 | Disposition: A | Discharge status: Home / Self Care | Payer: Medicaid Other | Source: Ambulatory Visit | Attending: Family Medicine | Admitting: Family Medicine

## 2016-11-18 DIAGNOSIS — R102 Pelvic and perineal pain: Secondary | ICD-10-CM | POA: Diagnosis present
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
## 2017-05-16 ENCOUNTER — Encounter
Admission: RE | Admit: 2017-05-16 | Discharge: 2017-05-16 | Disposition: A | Discharge status: Home / Self Care | Payer: Medicaid Other | Source: Ambulatory Visit | Attending: Obstetrics and Gynecology | Admitting: Obstetrics and Gynecology

## 2017-05-16 DIAGNOSIS — Z01812 Encounter for preprocedural laboratory examination: Secondary | ICD-10-CM | POA: Diagnosis not present

## 2017-05-16 DIAGNOSIS — G935 Compression of brain: Secondary | ICD-10-CM | POA: Diagnosis not present

## 2017-05-16 DIAGNOSIS — D508 Other iron deficiency anemias: Secondary | ICD-10-CM | POA: Diagnosis not present

## 2017-05-16 DIAGNOSIS — N921 Excessive and frequent menstruation with irregular cycle: Secondary | ICD-10-CM | POA: Insufficient documentation

## 2017-05-16 DIAGNOSIS — R102 Pelvic and perineal pain: Secondary | ICD-10-CM | POA: Insufficient documentation

## 2017-05-16 DIAGNOSIS — N879 Dysplasia of cervix uteri, unspecified: Secondary | ICD-10-CM | POA: Diagnosis not present

## 2017-05-16 DIAGNOSIS — M797 Fibromyalgia: Secondary | ICD-10-CM | POA: Diagnosis not present

## 2017-05-16 DIAGNOSIS — F329 Major depressive disorder, single episode, unspecified: Secondary | ICD-10-CM | POA: Diagnosis not present

## 2017-05-16 DIAGNOSIS — Z9889 Other specified postprocedural states: Secondary | ICD-10-CM | POA: Insufficient documentation

## 2017-05-16 DIAGNOSIS — Z833 Family history of diabetes mellitus: Secondary | ICD-10-CM | POA: Diagnosis not present

## 2017-05-16 DIAGNOSIS — Z79899 Other long term (current) drug therapy: Secondary | ICD-10-CM | POA: Insufficient documentation

## 2017-05-16 DIAGNOSIS — N941 Unspecified dyspareunia: Secondary | ICD-10-CM | POA: Insufficient documentation

## 2017-05-16 DIAGNOSIS — Z809 Family history of malignant neoplasm, unspecified: Secondary | ICD-10-CM | POA: Diagnosis not present

## 2017-05-16 HISTORY — DX: Compression of brain: G93.5

## 2017-05-16 HISTORY — DX: Benign lipomatous neoplasm, unspecified: D17.9

## 2017-05-16 HISTORY — DX: Occipital neuralgia: M54.81

## 2017-05-16 HISTORY — DX: Headache, unspecified: R51.9

## 2017-05-16 HISTORY — DX: Dysplasia of cervix uteri, unspecified: N87.9

## 2017-05-16 HISTORY — DX: Nontoxic goiter, unspecified: E04.9

## 2017-05-16 HISTORY — DX: Headache: R51

## 2017-05-16 LAB — BASIC METABOLIC PANEL
Anion gap: 7 (ref 5–15)
BUN: 11 mg/dL (ref 6–20)
CHLORIDE: 105 mmol/L (ref 101–111)
CO2: 26 mmol/L (ref 22–32)
Calcium: 8.7 mg/dL — ABNORMAL LOW (ref 8.9–10.3)
Creatinine, Ser: 0.76 mg/dL (ref 0.44–1.00)
GFR calc Af Amer: >60 mL/min (ref >60)
GFR calc non Af Amer: >60 mL/min (ref >60)
Glucose, Bld: 71 mg/dL (ref 65–99)
POTASSIUM: 3.8 mmol/L (ref 3.5–5.1)
SODIUM: 138 mmol/L (ref 135–145)

## 2017-05-16 LAB — CBC
HEMATOCRIT: 30.9 % — AB (ref 35.0–47.0)
Hemoglobin: 10.3 g/dL — ABNORMAL LOW (ref 12.0–16.0)
MCH: 30 pg (ref 26.0–34.0)
MCHC: 33.5 g/dL (ref 32.0–36.0)
MCV: 89.5 fL (ref 80.0–100.0)
Platelets: 267 10*3/uL (ref 150–440)
RBC: 3.45 MIL/uL — ABNORMAL LOW (ref 3.80–5.20)
RDW: 18.2 % — AB (ref 11.5–14.5)
WBC: 3.5 10*3/uL — AB (ref 3.6–11.0)

## 2017-05-16 LAB — TYPE AND SCREEN
ABO/RH(D): O POS
Antibody Screen: NEGATIVE

## 2017-05-16 NOTE — Patient Instructions (Signed)
  Your procedure is scheduled on: 05-26-17 MONDAY Report to Same Day Surgery 2nd floor medical mall [facility] Entrance-take elevator on left to 2nd floor.  Check in with surgery information desk.) To find out your arrival time please call [phone] between 1PM - 3PM on 05-23-17 FRIDAY  Remember: Instructions that are not followed completely may result in serious medical risk, up to and including death, or upon the discretion of your surgeon and anesthesiologist your surgery may need to be rescheduled.    _x___ 1. Do not eat food after midnight the night before your procedure. NO GUM CHEWING OR HARD CANDIES. You may drink clear liquids up to 2 hours before you are scheduled to arrive at the hospital for your procedure.  Do not drink clear liquids within 2 hours of your scheduled arrival to the hospital.  Clear liquids include  --Water or Apple juice without pulp  --Clear carbohydrate beverage such as ClearFast or Gatorade  --Black Coffee or Clear Tea (No milk, no creamers, do not add anything to the coffee or Tea)  Type 1 and type 2 diabetics should only drink water.     __x__ 2. No Alcohol for 24 hours before or after surgery.   __x__3. No Smoking for 24 prior to surgery.   ____  4. Bring all medications with you on the day of surgery if instructed.    __x__ 5. Notify your doctor if there is any change in your medical condition     (cold, fever, infections).     Do not wear jewelry, make-up, hairpins, clips or nail polish.  Do not wear lotions, powders, or perfumes. You may wear deodorant.  Do not shave 48 hours prior to surgery. Men may shave face and neck.  Do not bring valuables to the hospital.    [facility] is not responsible for any belongings or valuables.               Contacts, dentures or bridgework may not be worn into surgery.  Leave your suitcase in the car. After surgery it may be brought to your room.  For patients admitted to the hospital, discharge time is  determined by your treatment team.   Patients discharged the day of surgery will not be allowed to drive home.  You will need someone to drive you home and stay with you the night of your procedure.    Please read over the following fact sheets that you were given:     ____ Take anti-hypertensive listed below, cardiac, seizure, asthma, anti-reflux and psychiatric medicines. These include:  1. NONE  2.  3.  4.  5.  6.  ____Fleets enema or Magnesium Citrate as directed.   _x___ Use CHG Soap or sage wipes as directed on instruction sheet   ____ Use inhalers on the day of surgery and bring to hospital day of surgery  ____ Stop Metformin and Janumet 2 days prior to surgery.    ____ Take 1/2 of usual insulin dose the night before surgery and none on the morning surgery.   ____ Follow recommendations from Cardiologist, Pulmonologist or PCP regarding stopping Aspirin, Coumadin, Plavix ,Eliquis, Effient, or Pradaxa, and Pletal.  X____Stop Anti-inflammatories such as Advil, Aleve, Ibuprofen, Motrin, NAPROXEN, Naprosyn, Goodies powders or aspirin products 7 DAYS PRIOR TO SURGERY-OK to take Tylenol    ____ Stop supplements until after surgery.     ____ Bring C-Pap to the hospital.

## 2017-05-25 MED ORDER — CEFOXITIN SODIUM-DEXTROSE 2-2.2 GM-% IV SOLR (PREMIX)
2.0000 g | INTRAVENOUS | Status: AC
  Administered 2017-05-26: 2 g via INTRAVENOUS

## 2017-05-26 ENCOUNTER — Ambulatory Visit: Payer: Medicaid Other | Admitting: Anesthesiology

## 2017-05-26 ENCOUNTER — Encounter
Admission: RE | Disposition: A | Discharge status: Home / Self Care | Payer: Self-pay | Source: Ambulatory Visit | Attending: Obstetrics and Gynecology

## 2017-05-26 ENCOUNTER — Encounter: Payer: Self-pay | Admitting: *Deleted

## 2017-05-26 ENCOUNTER — Inpatient Hospital Stay
Admission: RE | Admit: 2017-05-26 | Discharge: 2017-05-27 | DRG: 983 | Disposition: A | Discharge status: Home / Self Care | Payer: Medicaid Other | Source: Ambulatory Visit | Attending: Obstetrics and Gynecology | Admitting: Obstetrics and Gynecology

## 2017-05-26 DIAGNOSIS — F17200 Nicotine dependence, unspecified, uncomplicated: Secondary | ICD-10-CM | POA: Diagnosis present

## 2017-05-26 DIAGNOSIS — R102 Pelvic and perineal pain: Principal | ICD-10-CM | POA: Diagnosis present

## 2017-05-26 DIAGNOSIS — D500 Iron deficiency anemia secondary to blood loss (chronic): Secondary | ICD-10-CM | POA: Diagnosis present

## 2017-05-26 DIAGNOSIS — N920 Excessive and frequent menstruation with regular cycle: Secondary | ICD-10-CM | POA: Diagnosis present

## 2017-05-26 DIAGNOSIS — Z9889 Other specified postprocedural states: Secondary | ICD-10-CM

## 2017-05-26 DIAGNOSIS — N941 Unspecified dyspareunia: Secondary | ICD-10-CM | POA: Diagnosis present

## 2017-05-26 DIAGNOSIS — G8929 Other chronic pain: Secondary | ICD-10-CM | POA: Diagnosis present

## 2017-05-26 DIAGNOSIS — Z90710 Acquired absence of both cervix and uterus: Secondary | ICD-10-CM | POA: Diagnosis present

## 2017-05-26 HISTORY — PX: LAPAROSCOPIC VAGINAL HYSTERECTOMY WITH SALPINGECTOMY: SHX6680

## 2017-05-26 LAB — URINE DRUG SCREEN, QUALITATIVE (ARMC ONLY)
AMPHETAMINES, UR SCREEN: NOT DETECTED
BARBITURATES, UR SCREEN: NOT DETECTED
BENZODIAZEPINE, UR SCRN: NOT DETECTED
CANNABINOID 50 NG, UR ~~LOC~~: POSITIVE — AB
Cocaine Metabolite,Ur ~~LOC~~: NOT DETECTED
MDMA (ECSTASY) UR SCREEN: NOT DETECTED
Methadone Scn, Ur: NOT DETECTED
OPIATE, UR SCREEN: NOT DETECTED
PHENCYCLIDINE (PCP) UR S: NOT DETECTED
Tricyclic, Ur Screen: NOT DETECTED

## 2017-05-26 LAB — POCT PREGNANCY, URINE: PREG TEST UR: NEGATIVE

## 2017-05-26 LAB — ABO/RH: ABO/RH(D): O POS

## 2017-05-26 SURGERY — HYSTERECTOMY, VAGINAL, LAPAROSCOPY-ASSISTED, WITH SALPINGECTOMY
Anesthesia: General | Laterality: Bilateral

## 2017-05-26 MED ORDER — OXYCODONE-ACETAMINOPHEN 5-325 MG PO TABS
1.0000 | ORAL_TABLET | ORAL | Status: DC | PRN
  Administered 2017-05-26: 2 via ORAL
  Filled 2017-05-26: qty 2

## 2017-05-26 MED ORDER — FENTANYL CITRATE (PF) 100 MCG/2ML IJ SOLN
25.0000 ug | INTRAMUSCULAR | Status: DC | PRN
  Administered 2017-05-26 (×4): 25 ug via INTRAVENOUS

## 2017-05-26 MED ORDER — DEXAMETHASONE SODIUM PHOSPHATE 10 MG/ML IJ SOLN
INTRAMUSCULAR | Status: DC | PRN
  Administered 2017-05-26: 5 mg via INTRAVENOUS

## 2017-05-26 MED ORDER — BUPIVACAINE HCL 0.5 % IJ SOLN
INTRAMUSCULAR | Status: DC | PRN
  Administered 2017-05-26: 15 mL

## 2017-05-26 MED ORDER — SUGAMMADEX SODIUM 200 MG/2ML IV SOLN
INTRAVENOUS | Status: AC
  Filled 2017-05-26: qty 2

## 2017-05-26 MED ORDER — INFLUENZA VAC SPLIT QUAD 0.5 ML IM SUSY
0.5000 mL | PREFILLED_SYRINGE | INTRAMUSCULAR | Status: AC
  Administered 2017-05-27: 0.5 mL via INTRAMUSCULAR
  Filled 2017-05-26: qty 0.5

## 2017-05-26 MED ORDER — ONDANSETRON HCL 4 MG PO TABS: 4.0000 mg | ORAL_TABLET | Freq: Four times a day (QID) | ORAL | Status: DC | PRN

## 2017-05-26 MED ORDER — ONDANSETRON HCL 4 MG/2ML IJ SOLN: 4.0000 mg | Freq: Once | INTRAMUSCULAR | Status: DC | PRN

## 2017-05-26 MED ORDER — LACTATED RINGERS IV SOLN
INTRAVENOUS | Status: DC
  Administered 2017-05-26 – 2017-05-27 (×2): via INTRAVENOUS

## 2017-05-26 MED ORDER — FENTANYL CITRATE (PF) 100 MCG/2ML IJ SOLN
INTRAMUSCULAR | Status: AC
  Administered 2017-05-26: 25 ug via INTRAVENOUS
  Filled 2017-05-26: qty 2

## 2017-05-26 MED ORDER — ONDANSETRON HCL 4 MG/2ML IJ SOLN: 4.0000 mg | Freq: Four times a day (QID) | INTRAMUSCULAR | Status: DC | PRN

## 2017-05-26 MED ORDER — PROPOFOL 10 MG/ML IV BOLUS
INTRAVENOUS | Status: DC | PRN
  Administered 2017-05-26: 200 mg via INTRAVENOUS

## 2017-05-26 MED ORDER — SIMETHICONE 80 MG PO CHEW: 80.0000 mg | CHEWABLE_TABLET | Freq: Four times a day (QID) | ORAL | Status: DC | PRN

## 2017-05-26 MED ORDER — LIDOCAINE HCL (CARDIAC) 20 MG/ML IV SOLN
INTRAVENOUS | Status: DC | PRN
  Administered 2017-05-26: 100 mg via INTRAVENOUS

## 2017-05-26 MED ORDER — PROPOFOL 10 MG/ML IV BOLUS
INTRAVENOUS | Status: AC
  Filled 2017-05-26: qty 20

## 2017-05-26 MED ORDER — MIDAZOLAM HCL 2 MG/2ML IJ SOLN
INTRAMUSCULAR | Status: DC | PRN
  Administered 2017-05-26: 2 mg via INTRAVENOUS

## 2017-05-26 MED ORDER — ROCURONIUM BROMIDE 50 MG/5ML IV SOLN
INTRAVENOUS | Status: AC
  Filled 2017-05-26: qty 1

## 2017-05-26 MED ORDER — ACETAMINOPHEN 10 MG/ML IV SOLN
INTRAVENOUS | Status: DC | PRN
  Administered 2017-05-26: 1000 mg via INTRAVENOUS

## 2017-05-26 MED ORDER — FENTANYL CITRATE (PF) 100 MCG/2ML IJ SOLN
INTRAMUSCULAR | Status: DC | PRN
  Administered 2017-05-26 (×2): 50 ug via INTRAVENOUS

## 2017-05-26 MED ORDER — LIDOCAINE HCL (PF) 2 % IJ SOLN
INTRAMUSCULAR | Status: AC
  Filled 2017-05-26: qty 2

## 2017-05-26 MED ORDER — FAMOTIDINE 20 MG PO TABS
20.0000 mg | ORAL_TABLET | Freq: Once | ORAL | Status: AC
  Administered 2017-05-26: 20 mg via ORAL

## 2017-05-26 MED ORDER — ONDANSETRON HCL 4 MG/2ML IJ SOLN
INTRAMUSCULAR | Status: AC
  Filled 2017-05-26: qty 2

## 2017-05-26 MED ORDER — LACTATED RINGERS IV SOLN
INTRAVENOUS | Status: DC
  Administered 2017-05-26: 10:00:00 via INTRAVENOUS

## 2017-05-26 MED ORDER — LIDOCAINE-EPINEPHRINE 1 %-1:100000 IJ SOLN
INTRAMUSCULAR | Status: DC | PRN
  Administered 2017-05-26: 8 mL

## 2017-05-26 MED ORDER — SUCCINYLCHOLINE CHLORIDE 20 MG/ML IJ SOLN
INTRAMUSCULAR | Status: DC | PRN
  Administered 2017-05-26: 100 mg via INTRAVENOUS

## 2017-05-26 MED ORDER — EPHEDRINE SULFATE 50 MG/ML IJ SOLN
INTRAMUSCULAR | Status: DC | PRN
Start: 2017-05-26 — End: 2017-05-26
  Administered 2017-05-26: 5 mg via INTRAVENOUS

## 2017-05-26 MED ORDER — FAMOTIDINE 20 MG PO TABS
ORAL_TABLET | ORAL | Status: AC
  Administered 2017-05-26: 20 mg via ORAL
  Filled 2017-05-26: qty 1

## 2017-05-26 MED ORDER — MIDAZOLAM HCL 2 MG/2ML IJ SOLN
INTRAMUSCULAR | Status: AC
  Filled 2017-05-26: qty 2

## 2017-05-26 MED ORDER — FENTANYL CITRATE (PF) 100 MCG/2ML IJ SOLN
INTRAMUSCULAR | Status: AC
  Filled 2017-05-26: qty 2

## 2017-05-26 MED ORDER — ROCURONIUM BROMIDE 100 MG/10ML IV SOLN
INTRAVENOUS | Status: DC | PRN
  Administered 2017-05-26: 10 mg via INTRAVENOUS
  Administered 2017-05-26: 5 mg via INTRAVENOUS
  Administered 2017-05-26: 15 mg via INTRAVENOUS

## 2017-05-26 MED ORDER — DEXAMETHASONE SODIUM PHOSPHATE 10 MG/ML IJ SOLN
INTRAMUSCULAR | Status: AC
  Filled 2017-05-26: qty 1

## 2017-05-26 MED ORDER — SUCCINYLCHOLINE CHLORIDE 20 MG/ML IJ SOLN
INTRAMUSCULAR | Status: AC
  Filled 2017-05-26: qty 1

## 2017-05-26 MED ORDER — KETOROLAC TROMETHAMINE 30 MG/ML IJ SOLN
30.0000 mg | Freq: Three times a day (TID) | INTRAMUSCULAR | Status: DC | PRN
  Administered 2017-05-26: 30 mg via INTRAVENOUS
  Filled 2017-05-26: qty 1

## 2017-05-26 MED ORDER — MORPHINE SULFATE (PF) 2 MG/ML IV SOLN
1.0000 mg | INTRAVENOUS | Status: DC | PRN
  Administered 2017-05-26: 1 mg via INTRAVENOUS
  Filled 2017-05-26: qty 1

## 2017-05-26 MED ORDER — KETOROLAC TROMETHAMINE 30 MG/ML IJ SOLN
INTRAMUSCULAR | Status: DC | PRN
  Administered 2017-05-26: 15 mg via INTRAVENOUS

## 2017-05-26 MED ORDER — BUPIVACAINE HCL (PF) 0.5 % IJ SOLN
INTRAMUSCULAR | Status: AC
  Filled 2017-05-26: qty 30

## 2017-05-26 MED ORDER — LIDOCAINE-EPINEPHRINE 1 %-1:100000 IJ SOLN
INTRAMUSCULAR | Status: AC
  Filled 2017-05-26: qty 1

## 2017-05-26 MED ORDER — CEFOXITIN SODIUM-DEXTROSE 2-2.2 GM-% IV SOLR (PREMIX)
INTRAVENOUS | Status: AC
  Filled 2017-05-26: qty 50

## 2017-05-26 MED ORDER — ONDANSETRON HCL 4 MG/2ML IJ SOLN
INTRAMUSCULAR | Status: DC | PRN
  Administered 2017-05-26: 4 mg via INTRAVENOUS

## 2017-05-26 SURGICAL SUPPLY — 45 items
BAG URO DRAIN 2000ML W/SPOUT (MISCELLANEOUS) ×3 IMPLANT
BLADE SURG SZ11 CARB STEEL (BLADE) ×3 IMPLANT
CATH FOLEY 2WAY  5CC 16FR (CATHETERS) ×2
CATH URTH 16FR FL 2W BLN LF (CATHETERS) ×1 IMPLANT
CHLORAPREP W/TINT 26ML (MISCELLANEOUS) ×3 IMPLANT
CLOSURE WOUND 1/2 X4 (GAUZE/BANDAGES/DRESSINGS) ×1
DERMABOND ADVANCED (GAUZE/BANDAGES/DRESSINGS) ×2
DERMABOND ADVANCED .7 DNX12 (GAUZE/BANDAGES/DRESSINGS) ×1 IMPLANT
DRAPE SURG 17X11 SM STRL (DRAPES) ×3 IMPLANT
DRSG TEGADERM 2X2.25 PEDS (GAUZE/BANDAGES/DRESSINGS) ×3 IMPLANT
ELECT REM PT RETURN 9FT ADLT (ELECTROSURGICAL) ×3
ELECTRODE REM PT RTRN 9FT ADLT (ELECTROSURGICAL) ×1 IMPLANT
FILTER LAP SMOKE EVAC STRL (MISCELLANEOUS) ×3 IMPLANT
GLOVE BIO SURGEON STRL SZ8 (GLOVE) ×12 IMPLANT
GOWN STRL REUS W/ TWL LRG LVL3 (GOWN DISPOSABLE) ×3 IMPLANT
GOWN STRL REUS W/ TWL XL LVL3 (GOWN DISPOSABLE) ×3 IMPLANT
GOWN STRL REUS W/TWL LRG LVL3 (GOWN DISPOSABLE) ×6
GOWN STRL REUS W/TWL XL LVL3 (GOWN DISPOSABLE) ×6
GRASPER SUT TROCAR 14GX15 (MISCELLANEOUS) ×3 IMPLANT
HANDLE YANKAUER SUCT BULB TIP (MISCELLANEOUS) ×3 IMPLANT
IRRIGATION STRYKERFLOW (MISCELLANEOUS) ×1 IMPLANT
IRRIGATOR STRYKERFLOW (MISCELLANEOUS) ×3
KIT PINK PAD W/HEAD ARE REST (MISCELLANEOUS) ×3
KIT PINK PAD W/HEAD ARM REST (MISCELLANEOUS) ×1 IMPLANT
KIT RM TURNOVER CYSTO AR (KITS) ×3 IMPLANT
LABEL OR SOLS (LABEL) ×3 IMPLANT
PACK BASIN MINOR ARMC (MISCELLANEOUS) ×3 IMPLANT
PACK GYN LAPAROSCOPIC (MISCELLANEOUS) ×3 IMPLANT
PAD OB MATERNITY 4.3X12.25 (PERSONAL CARE ITEMS) ×3 IMPLANT
SCISSORS METZENBAUM CVD 33 (INSTRUMENTS) ×3 IMPLANT
SHEARS HARMONIC ACE PLUS 36CM (ENDOMECHANICALS) ×3 IMPLANT
SLEEVE ENDOPATH XCEL 5M (ENDOMECHANICALS) ×3 IMPLANT
SPONGE XRAY 4X4 16PLY STRL (MISCELLANEOUS) ×3 IMPLANT
STRIP CLOSURE SKIN 1/2X4 (GAUZE/BANDAGES/DRESSINGS) ×2 IMPLANT
SUT VIC AB 0 CT1 27 (SUTURE) ×4
SUT VIC AB 0 CT1 27XCR 8 STRN (SUTURE) ×2 IMPLANT
SUT VIC AB 0 CT1 36 (SUTURE) ×6 IMPLANT
SUT VIC AB 0 CT2 27 (SUTURE) ×3 IMPLANT
SUT VIC AB 4-0 SH 27 (SUTURE) ×2
SUT VIC AB 4-0 SH 27XANBCTRL (SUTURE) ×1 IMPLANT
SYR CONTROL 10ML (SYRINGE) ×3 IMPLANT
SYRINGE 10CC LL (SYRINGE) ×3 IMPLANT
TROCAR ENDO BLADELESS 11MM (ENDOMECHANICALS) ×3 IMPLANT
TROCAR XCEL NON-BLD 5MMX100MML (ENDOMECHANICALS) ×3 IMPLANT
TUBING INSUF HEATED (TUBING) ×3 IMPLANT

## 2017-05-26 NOTE — Brief Op Note (Signed)
05/26/2017  1:03 PM  PATIENT:  Nicholaus Corolla  36 y.o. female  PRE-OPERATIVE DIAGNOSIS:  Chronic Pelvic Pain ; Menorrhagia  POST-OPERATIVE DIAGNOSIS:  Chronic Pelvic Pain ; Menorrhagia Pelvic adhesions , mild PROCEDURE:  Procedure(s): LAPAROSCOPIC ASSISTED VAGINAL HYSTERECTOMY WITH SALPINGECTOMY (Bilateral)  SURGEON:  Surgeon(s) and Role:    * Glenetta Kiger, Gwen Her, MD - Primary    * Benjaman Kindler, MD  PHYSICIAN ASSISTANT: Kyra Leyland, [place] student   ASSISTANTS: none   ANESTHESIA:   general  EBL:  Total I/O In: 600 [I.V.:600] Out: 300 [Urine:100; Blood:200]  BLOOD ADMINISTERED:none  DRAINS: Urinary Catheter (Foley)   LOCAL MEDICATIONS USED:  MARCAINE    and LIDOCAINE   SPECIMEN:  Source of Specimen:  cervix ,uterus and bilateral faloopian tubes   DISPOSITION OF SPECIMEN:  PATHOLOGY  COUNTS:  YES  TOURNIQUET:  * No tourniquets in log *  DICTATION: .Other Dictation: Dictation Number verbal   PLAN OF CARE: Admit for overnight observation  PATIENT DISPOSITION:  PACU - hemodynamically stable.   Delay start of Pharmacological VTE agent (>24hrs) due to surgical blood loss or risk of bleeding: not applicable

## 2017-05-26 NOTE — Progress Notes (Signed)
Pt ready for LAVH and bilateral salpingectomy  LAbs reviewed  NPO all questions answered

## 2017-05-26 NOTE — Progress Notes (Signed)
Patient ID: Kelli Richard, female   DOB: 1980-12-03, 36 y.o.   MRN: [id number] Dos LAVH , bilat salpingectomy  Pain in good control Good urine out put vss  D/c foley  And slow IVF

## 2017-05-26 NOTE — Anesthesia Post-op Follow-up Note (Signed)
Anesthesia QCDR form completed.        

## 2017-05-26 NOTE — Transfer of Care (Signed)
Immediate Anesthesia Transfer of Care Note  Patient: Kelli Richard  Procedure(s) Performed: Procedure(s): LAPAROSCOPIC ASSISTED VAGINAL HYSTERECTOMY WITH SALPINGECTOMY (Bilateral)  Patient Location: PACU  Anesthesia Type:General  Level of Consciousness: awake  Airway & Oxygen Therapy: Patient Spontanous Breathing  Post-op Assessment: Report given to RN  Post vital signs: Reviewed  Last Vitals:  Vitals:   05/26/17 0828  BP: 120/63  Pulse: 72  Resp: 18  Temp: 36.4 C  SpO2: 100%    Last Pain:  Vitals:   05/26/17 0828  TempSrc: Tympanic  PainSc: 9          Complications: No apparent anesthesia complications

## 2017-05-26 NOTE — Anesthesia Procedure Notes (Signed)
Procedure Name: Intubation Date/Time: 05/26/2017 10:33 AM Performed by: Zetta Bills Pre-anesthesia Checklist: Patient identified, Emergency Drugs available, Suction available and Patient being monitored Patient Re-evaluated:Patient Re-evaluated prior to induction Oxygen Delivery Method: Circle system utilized Preoxygenation: Pre-oxygenation with 100% oxygen Induction Type: IV induction Ventilation: Mask ventilation without difficulty Laryngoscope Size: Mac and 3 Grade View: Grade I Tube type: Oral Tube size: 7.0 mm Number of attempts: 1 Airway Equipment and Method: Stylet Placement Confirmation: ETT inserted through vocal cords under direct vision,  positive ETCO2,  CO2 detector and breath sounds checked- equal and bilateral Secured at: 21 cm Tube secured with: Tape Dental Injury: Teeth and Oropharynx as per pre-operative assessment

## 2017-05-26 NOTE — Anesthesia Preprocedure Evaluation (Addendum)
Anesthesia Evaluation  Patient identified by MRN, date of birth, ID band  Reviewed: Allergy & Precautions, NPO status , Patient's Chart, lab work & pertinent test results  Airway Mallampati: II  TM Distance: >3 FB     Dental  (+) Teeth Intact   Pulmonary Current Smoker,    Pulmonary exam normal        Cardiovascular negative cardio ROS Normal cardiovascular exam     Neuro/Psych  Headaches, Arnold chiari malformation negative psych ROS   GI/Hepatic negative GI ROS, Neg liver ROS,   Endo/Other  negative endocrine ROS  Renal/GU negative Renal ROS  Female GU complaint     Musculoskeletal  (+) Fibromyalgia -  Abdominal Normal abdominal exam  (+)   Peds negative pediatric ROS (+)  Hematology  (+) anemia ,   Anesthesia Other Findings Past Medical History: No date: Anemia No date: Bilateral occipital neuralgia No date: Cervical dysplasia No date: Chiari malformation type I (HCC) No date: Fibromyalgia No date: Headache     Comment:  migraines No date: Lipoma No date: Thyroid goiter  Reproductive/Obstetrics                            Anesthesia Physical Anesthesia Plan  ASA: II  Anesthesia Plan: General   Post-op Pain Management:    Induction: Intravenous  PONV Risk Score and Plan:   Airway Management Planned: Oral ETT  Additional Equipment:   Intra-op Plan:   Post-operative Plan: Extubation in OR  Informed Consent: I have reviewed the patients History and Physical, chart, labs and discussed the procedure including the risks, benefits and alternatives for the proposed anesthesia with the patient or authorized representative who has indicated his/her understanding and acceptance.   Dental advisory given  Plan Discussed with: CRNA and Surgeon  Anesthesia Plan Comments:         Anesthesia Quick Evaluation

## 2017-05-27 ENCOUNTER — Encounter: Payer: Self-pay | Admitting: Obstetrics and Gynecology

## 2017-05-27 DIAGNOSIS — Z90710 Acquired absence of both cervix and uterus: Secondary | ICD-10-CM | POA: Diagnosis present

## 2017-05-27 LAB — CBC
HCT: 30.1 % — ABNORMAL LOW (ref 35.0–47.0)
HEMOGLOBIN: 9.8 g/dL — AB (ref 12.0–16.0)
MCH: 29.1 pg (ref 26.0–34.0)
MCHC: 32.6 g/dL (ref 32.0–36.0)
MCV: 89.1 fL (ref 80.0–100.0)
PLATELETS: 225 10*3/uL (ref 150–440)
RBC: 3.38 MIL/uL — AB (ref 3.80–5.20)
RDW: 17.3 % — ABNORMAL HIGH (ref 11.5–14.5)
WBC: 11.6 10*3/uL — AB (ref 3.6–11.0)

## 2017-05-27 LAB — BASIC METABOLIC PANEL
ANION GAP: 5 (ref 5–15)
BUN: 6 mg/dL (ref 6–20)
CHLORIDE: 108 mmol/L (ref 101–111)
CO2: 26 mmol/L (ref 22–32)
Calcium: 8.3 mg/dL — ABNORMAL LOW (ref 8.9–10.3)
Creatinine, Ser: 0.82 mg/dL (ref 0.44–1.00)
Glucose, Bld: 112 mg/dL — ABNORMAL HIGH (ref 65–99)
POTASSIUM: 3.2 mmol/L — AB (ref 3.5–5.1)
SODIUM: 139 mmol/L (ref 135–145)

## 2017-05-27 LAB — SURGICAL PATHOLOGY

## 2017-05-27 MED ORDER — DOCUSATE SODIUM 100 MG PO CAPS: 100.0000 mg | ORAL_CAPSULE | Freq: Two times a day (BID) | ORAL | Status: DC | PRN

## 2017-05-27 MED ORDER — OXYCODONE-ACETAMINOPHEN 5-325 MG PO TABS: 1.0000 | ORAL_TABLET | ORAL | 0 refills | Status: DC | PRN

## 2017-05-27 MED ORDER — IBUPROFEN 800 MG PO TABS: 800.0000 mg | ORAL_TABLET | Freq: Three times a day (TID) | ORAL | 0 refills | Status: DC | PRN

## 2017-05-27 MED ORDER — ONDANSETRON HCL 4 MG PO TABS: 4.0000 mg | ORAL_TABLET | Freq: Four times a day (QID) | ORAL | 0 refills | Status: DC | PRN

## 2017-05-27 NOTE — Anesthesia Postprocedure Evaluation (Signed)
Anesthesia Post Note  Patient: Kelli Richard  Procedure(s) Performed: Procedure(s) (LRB): LAPAROSCOPIC ASSISTED VAGINAL HYSTERECTOMY WITH SALPINGECTOMY (Bilateral)  Patient location during evaluation: PACU Anesthesia Type: General Level of consciousness: awake and alert and oriented Pain management: pain level controlled Vital Signs Assessment: post-procedure vital signs reviewed and stable Respiratory status: spontaneous breathing Cardiovascular status: blood pressure returned to baseline Anesthetic complications: no     Last Vitals:  Vitals:   05/27/17 0455 05/27/17 0727  BP: (!) 95/48 (!) 96/45  Pulse: 79 79  Resp: 18 20  Temp: 36.7 C 37.2 C  SpO2: 100% 99%    Last Pain:  Vitals:   05/27/17 0800  TempSrc:   PainSc: 2                  Ennifer Harston

## 2017-05-27 NOTE — Progress Notes (Signed)
May 27, 2017  Patient: Kelli Richard  Date of Birth: 07-24-1981  Date of Visit: 04/23/2017    To Whom It May Concern:  Josette Shimabukuro was seen and treated in our Hospital on 04/25/2017-04/26/2017. Please excuse Sheldon Silvan from school on 04/25/2017 as she was here as support for her Mother.  Sincerely,   Orie Rout, RN

## 2017-05-27 NOTE — Op Note (Signed)
NAME:  Kelli Richard, Kelli Richard NO.:  [id number]  MEDICAL RECORD NO.:  [id number]  LOCATION:                                 FACILITY:  PHYSICIAN:  Laverta Baltimore, MD     DATE OF BIRTH:  DATE OF PROCEDURE:  05/26/2017 DATE OF DISCHARGE:                              OPERATIVE REPORT   PREOPERATIVE DIAGNOSIS: 1. Menorrhagia. 2. Chronic pelvic pain. 3. Dyspareunia.  POSTOPERATIVE DIAGNOSIS: 1. Menorrhagia. 2. Chronic pelvic pain. 3. Dyspareunia. 4. Mild pelvic adhesions.  PROCEDURE PERFORMED: 1. Laparoscopic-assisted vaginal hysterectomy. 2. Bilateral salpingectomy.  SURGEON:  Laverta Baltimore, MD  ANESTHESIA:  General endotracheal anesthesia.  FIRST ASSISTANT:  Leafy Ro.  SECOND ASSISTANT:  Kyra Leyland, PA student.  INDICATION:  A 36 year old, gravida 3, para 3 patient with a long history of chronic pelvic pain, bilateral central also with significant menorrhagia with iron-deficiency anemia and significant dyspareunia.  DESCRIPTION OF PROCEDURE:  After adequate general endotracheal anesthesia, the patient was placed in dorsal supine position, legs in the [place] stirrups.  The abdomen, perineum, and vagina were prepped and draped in normal sterile fashion.  Time-out was performed.  The patient did receive 2 g IV cefoxitin prior to commencement of the case. Straight catheterization of the bladder yielded 50 mL clear urine. Gloves were changed and attention was directed to the abdomen.  The infraumbilical incision was injected with 0.5% Marcaine and then, a 15 mm infraumbilical incision was made.  The laparoscope was advanced into the abdominal cavity under direct visualization with the Optiview cannula.  The patient's abdomen was insufflated with carbon dioxide. Second port site was placed left lower quadrant, 3 cm medial to the left anterior iliac spine.  A third port site, a 5 mm trocar was advanced on the right lower quadrant, 3 cm medial to the  right anterior iliac spine and under direct visualization, trocar was advanced into the abdominal cavity.  Initial impression showed several thin adhesions of the omentum to the mid anterior abdominal wall.  Harmonic scalpel was brought up to the operative field and these adhesions were removed.  There was a small amount of vesicle formation on the fallopian tubes and anterior serosa of the bladder.  No significant endometriosis identified.  No other dense adhesions.  The left fallopian tube was grasped at the fimbriated end and the Harmonic scalpel used to dissect the fallopian tube off the mesosalpinx.  The left round ligament was then opened, followed by opening the left uterine ovarian ligament and sequential bites through the broad ligament was performed.  The left uterine artery was skeletonized and cauterized with the Kleppinger's and then transected. The bladder flap was created and the bladder was reflected caudad. Similar procedure was repeated on the patient's right side.  Again, the right fallopian tube was dissected free from the ovary.  Broad ligament was opened, and the right uterine artery was skeletonized, cauterized, and transected.  Good hemostasis was noted.  Of note, there was normal peristaltic ureteral activity prior to starting the surgery.  Attention was then directed vaginally where the weighted speculum was placed in the posterior vaginal vault.  The cervix was grasped with 2 thyroid tenacula.  Cervix was circumferentially injected with 1% lidocaine  with epinephrine.  A direct posterior colpotomy incision was made upon entry into the posterior cul-de-sac.  A long weighted billed speculum was placed.  The uterosacral ligaments were bilaterally clamped, transected, and tagged for later identification.  The anterior cervix was then incised with the Bovie and the anterior cul-de-sac was entered sharply. The cardinal ligaments were bilaterally clamped, transected,  suture ligated with 0 Vicryl suture.  Several bites were used of the broad ligament and ultimately the cervix and the uterus were delivered with the attached fallopian tubes.  Good hemostasis was noted.  The vaginal cuff was then closed with a running 0 Vicryl suture.  The uterosacral ligaments were plicated centrally, and the rest of vaginal cuff was closed.  Two additional figure-of-eight sutures used to reinforce the vaginal cuff.  The patient's abdomen was then re-insufflated and a small edge was noted to be bleeding on the posterior vaginal cuff edge, which was cauterized.  The patient's abdomen was irrigated and suctioned.  No additional bleeding noted.  Pressure was lowered to 7 mmHg and again, good hemostasis was noted.  The patient's abdomen was then deflated and the infraumbilical incision was closed with a fascial layer of 2-0 Vicryl suture and all skin incisions were closed with interrupted 4-0 Vicryl suture.  Sterile dressings were applied.  There were no complications.  ESTIMATED BLOOD LOSS:  200 mL.  URINE OUTPUT:  100 mL.  INTRAOPERATIVE FLUID:  600 mL.  The patient tolerated the procedure well and was taken to recovery room in good condition.    ______________________________ Laverta Baltimore, MD   ______________________________ Laverta Baltimore, MD    TS/MEDQ  D:  05/26/2017  T:  05/26/2017  Job:  [id number]

## 2017-05-27 NOTE — Discharge Summary (Signed)
Physician Discharge Summary  Patient ID: Kelli Richard MRN: [id number] DOB/AGE: Apr 26, 1981 36 y.o.  Admit date: 05/26/2017 Discharge date: 05/27/2017  Admission Diagnoses:CPP , menorrhagia   Discharge Diagnoses: same  Discharged Condition: good good Hospital Course: underwent an uncomplicated LAVH and bilat salpingecctomy  Consults: None  Significant Diagnostic Studies: labs:  Results for orders placed or performed during the hospital encounter of 05/26/17 (from the past 24 hour(s))  Urine Drug Screen, Qualitative ([facility] only)     Status: Abnormal   Collection Time: 05/26/17  8:20 AM  Result Value Ref Range   Tricyclic, Ur Screen NONE DETECTED NONE DETECTED   Amphetamines, Ur Screen NONE DETECTED NONE DETECTED   MDMA (Ecstasy)Ur Screen NONE DETECTED NONE DETECTED   Cocaine Metabolite,Ur [place] NONE DETECTED NONE DETECTED   Opiate, Ur Screen NONE DETECTED NONE DETECTED   Phencyclidine (PCP) Ur S NONE DETECTED NONE DETECTED   Cannabinoid 50 Ng, Ur [place] POSITIVE (A) NONE DETECTED   Barbiturates, Ur Screen NONE DETECTED NONE DETECTED   Benzodiazepine, Ur Scrn NONE DETECTED NONE DETECTED   Methadone Scn, Ur NONE DETECTED NONE DETECTED  Pregnancy, urine POC     Status: None   Collection Time: 05/26/17  8:29 AM  Result Value Ref Range   Preg Test, Ur NEGATIVE NEGATIVE  ABO/Rh     Status: None   Collection Time: 05/26/17  8:45 AM  Result Value Ref Range   ABO/RH(D) O POS   CBC     Status: Abnormal   Collection Time: 05/27/17  5:02 AM  Result Value Ref Range   WBC 11.6 (H) 3.6 - 11.0 K/uL   RBC 3.38 (L) 3.80 - 5.20 MIL/uL   Hemoglobin 9.8 (L) 12.0 - 16.0 g/dL   HCT 30.1 (L) 35.0 - 47.0 %   MCV 89.1 80.0 - 100.0 fL   MCH 29.1 26.0 - 34.0 pg   MCHC 32.6 32.0 - 36.0 g/dL   RDW 17.3 (H) 11.5 - 14.5 %   Platelets 225 150 - 440 K/uL  Basic metabolic panel     Status: Abnormal   Collection Time: 05/27/17  5:02 AM  Result Value Ref Range   Sodium 139 135 - 145 mmol/L   Potassium  3.2 (L) 3.5 - 5.1 mmol/L   Chloride 108 101 - 111 mmol/L   CO2 26 22 - 32 mmol/L   Glucose, Bld 112 (H) 65 - 99 mg/dL   BUN 6 6 - 20 mg/dL   Creatinine, Ser 0.82 0.44 - 1.00 mg/dL   Calcium 8.3 (L) 8.9 - 10.3 mg/dL   GFR calc non Af Amer >60 >60 mL/min   GFR calc Af Amer >60 >60 mL/min   Anion gap 5 5 - 15    Treatments: surgery: as above  Discharge Exam: Blood pressure (!) 96/45, pulse 79, temperature 99 F (37.2 C), temperature source Oral, resp. rate 20, height 5\' 6"  (1.676 m), weight 77.1 kg (170 lb), last menstrual period 05/07/2017, SpO2 99 %. General appearance: alert and cooperative Resp: clear to auscultation bilaterally Cardio: regular rate and rhythm, S1, S2 normal, no murmur, click, rub or gallop GI: soft, non-tender; bowel sounds normal; no masses,  no organomegaly Incision sites  Dry and covered  Disposition: 01-Home or Self Care  Discharge Instructions    Call MD for:    Complete by:  As directed    Call MD for:  difficulty breathing, headache or visual disturbances    Complete by:  As directed    Call MD  for:  extreme fatigue    Complete by:  As directed    Call MD for:  hives    Complete by:  As directed    Call MD for:  persistant dizziness or light-headedness    Complete by:  As directed    Call MD for:  persistant nausea and vomiting    Complete by:  As directed    Call MD for:  redness, tenderness, or signs of infection (pain, swelling, redness, odor or green/yellow discharge around incision site)    Complete by:  As directed    Call MD for:  severe uncontrolled pain    Complete by:  As directed    Call MD for:  temperature >100.4    Complete by:  As directed    Diet - low sodium heart healthy    Complete by:  As directed    Increase activity slowly    Complete by:  As directed      Allergies as of 05/27/2017   No Known Allergies     Medication List    STOP taking these medications   docusate sodium 100 MG capsule Commonly known as:   COLACE   ferrous sulfate 325 (65 FE) MG EC tablet   naproxen 500 MG tablet Commonly known as:  NAPROSYN     TAKE these medications   ibuprofen 800 MG tablet Commonly known as:  ADVIL,MOTRIN Take 1 tablet (800 mg total) by mouth every 8 (eight) hours as needed.   ondansetron 4 MG tablet Commonly known as:  ZOFRAN Take 1 tablet (4 mg total) by mouth every 6 (six) hours as needed for nausea.   oxyCODONE-acetaminophen 5-325 MG tablet Commonly known as:  PERCOCET/ROXICET Take 1-2 tablets by mouth every 4 (four) hours as needed (moderate to severe pain (when tolerating fluids)).   RESTASIS 0.05 % ophthalmic emulsion Generic drug:  cycloSPORINE Place 1 drop into both eyes 2 (two) times daily as needed. For dry eyes.   vitamin C 500 MG tablet Commonly known as:  ASCORBIC ACID Take 500 mg by mouth daily.            Discharge Care Instructions        Start     Ordered   05/27/17 0000  ondansetron (ZOFRAN) 4 MG tablet  Every 6 hours PRN     05/27/17 0730   05/27/17 0000  oxyCODONE-acetaminophen (PERCOCET/ROXICET) 5-325 MG tablet  Every 4 hours PRN     05/27/17 0730   05/27/17 0000  ibuprofen (ADVIL,MOTRIN) 800 MG tablet  Every 8 hours PRN     05/27/17 0730   05/27/17 0000  Increase activity slowly     05/27/17 0730   05/27/17 0000  Diet - low sodium heart healthy     05/27/17 0730   05/27/17 0000  Call MD for:  extreme fatigue     05/27/17 0730   05/27/17 0000  Call MD for:  persistant dizziness or light-headedness     05/27/17 0730   05/27/17 0000  Call MD for:  hives     05/27/17 0730   05/27/17 0000  Call MD for:  difficulty breathing, headache or visual disturbances     05/27/17 0730   05/27/17 0000  Call MD for:  redness, tenderness, or signs of infection (pain, swelling, redness, odor or green/yellow discharge around incision site)     05/27/17 0730   05/27/17 0000  Call MD for:  severe uncontrolled pain     05/27/17 0730  05/27/17 0000  Call MD for:   persistant nausea and vomiting     05/27/17 0730   05/27/17 0000  Call MD for:  temperature >100.4     05/27/17 0730   05/27/17 0000  Call MD for:     05/27/17 0730     Follow-up Information    Jacen Carlini, Gwen Her, MD. Call in 2 week(s).   Specialty:  Obstetrics and Gynecology Why:  post op  Contact information: [address] [place] [address] [phone]           Signed: Gwen Her Hero Mccathern 05/27/2017, 7:35 AM

## 2017-05-27 NOTE — Progress Notes (Signed)
May 27, 2017  Patient: ARDICE BOYAN  Date of Birth: 04/09/1981  Date of Visit: 04/23/2017    To Whom It May Concern:  ChalandaWilkersonwas seen and treated in our [facility] 04/25/2017-04/26/2017. Please excuse Shemaiah Round from school on 04/25/2017 as she was here as support for her Mother.  Sincerely,   Orie Rout, RN

## 2017-05-27 NOTE — Progress Notes (Signed)
  May 27, 2017  Patient: Kelli Richard  Date of Birth: 1981-06-27  Date of Visit: 04/23/2017    To Whom It May Concern:  [name] was seen and treated in our Hospital on 04/25/2017-04/26/2017. Please excuse [name] from school on 04/25/2017 as she was here as support for her Mother.  Sincerely,   Orie Rout, RN

## 2017-05-27 NOTE — Progress Notes (Signed)
Pt discharged home.  Discharge instructions, prescriptions and follow up appointment given to and reviewed with pt.  Pt verbalized understanding.  Escorted by auxillary. 

## 2017-09-04 ENCOUNTER — Other Ambulatory Visit: Payer: Self-pay | Admitting: Obstetrics and Gynecology

## 2017-09-04 DIAGNOSIS — R1909 Other intra-abdominal and pelvic swelling, mass and lump: Secondary | ICD-10-CM

## 2017-09-10 ENCOUNTER — Ambulatory Visit
Admission: RE | Admit: 2017-09-10 | Discharge: 2017-09-10 | Disposition: A | Discharge status: Home / Self Care | Payer: Medicaid Other | Source: Ambulatory Visit | Attending: Obstetrics and Gynecology | Admitting: Obstetrics and Gynecology

## 2017-09-10 DIAGNOSIS — R1909 Other intra-abdominal and pelvic swelling, mass and lump: Secondary | ICD-10-CM | POA: Diagnosis present

## 2017-11-23 ENCOUNTER — Other Ambulatory Visit: Payer: Self-pay

## 2017-11-23 ENCOUNTER — Observation Stay
Admission: EM | Admit: 2017-11-23 | Discharge: 2017-11-25 | Disposition: A | Discharge status: Home / Self Care | Payer: Medicaid Other | Attending: Obstetrics and Gynecology | Admitting: Obstetrics and Gynecology

## 2017-11-23 DIAGNOSIS — R102 Pelvic and perineal pain: Secondary | ICD-10-CM | POA: Diagnosis present

## 2017-11-23 DIAGNOSIS — S3141XA Laceration without foreign body of vagina and vulva, initial encounter: Secondary | ICD-10-CM

## 2017-11-23 DIAGNOSIS — Z79899 Other long term (current) drug therapy: Secondary | ICD-10-CM | POA: Diagnosis not present

## 2017-11-23 DIAGNOSIS — F17210 Nicotine dependence, cigarettes, uncomplicated: Secondary | ICD-10-CM | POA: Insufficient documentation

## 2017-11-23 DIAGNOSIS — N930 Postcoital and contact bleeding: Secondary | ICD-10-CM | POA: Diagnosis not present

## 2017-11-23 DIAGNOSIS — N83201 Unspecified ovarian cyst, right side: Secondary | ICD-10-CM | POA: Diagnosis not present

## 2017-11-23 DIAGNOSIS — N83202 Unspecified ovarian cyst, left side: Secondary | ICD-10-CM | POA: Insufficient documentation

## 2017-11-23 DIAGNOSIS — M797 Fibromyalgia: Secondary | ICD-10-CM | POA: Diagnosis not present

## 2017-11-23 DIAGNOSIS — N83512 Torsion of left ovary and ovarian pedicle: Secondary | ICD-10-CM | POA: Insufficient documentation

## 2017-11-23 DIAGNOSIS — N736 Female pelvic peritoneal adhesions (postinfective): Secondary | ICD-10-CM | POA: Diagnosis not present

## 2017-11-23 LAB — URINALYSIS, COMPLETE (UACMP) WITH MICROSCOPIC
BILIRUBIN URINE: NEGATIVE
Bacteria, UA: NONE SEEN
Glucose, UA: NEGATIVE mg/dL
Ketones, ur: 5 mg/dL — AB
LEUKOCYTES UA: NEGATIVE
Nitrite: NEGATIVE
PH: 6 (ref 5.0–8.0)
Protein, ur: 100 mg/dL — AB
SPECIFIC GRAVITY, URINE: 1.026 (ref 1.005–1.030)

## 2017-11-23 LAB — COMPREHENSIVE METABOLIC PANEL
ALT: 11 U/L — AB (ref 14–54)
ANION GAP: 12 (ref 5–15)
AST: 21 U/L (ref 15–41)
Albumin: 4 g/dL (ref 3.5–5.0)
Alkaline Phosphatase: 67 U/L (ref 38–126)
BUN: 9 mg/dL (ref 6–20)
CO2: 20 mmol/L — ABNORMAL LOW (ref 22–32)
CREATININE: 0.82 mg/dL (ref 0.44–1.00)
Calcium: 8.7 mg/dL — ABNORMAL LOW (ref 8.9–10.3)
Chloride: 104 mmol/L (ref 101–111)
Glucose, Bld: 133 mg/dL — ABNORMAL HIGH (ref 65–99)
POTASSIUM: 2.9 mmol/L — AB (ref 3.5–5.1)
Sodium: 136 mmol/L (ref 135–145)
Total Bilirubin: 0.9 mg/dL (ref 0.3–1.2)
Total Protein: 7.1 g/dL (ref 6.5–8.1)

## 2017-11-23 LAB — CBC
HCT: 35 % (ref 35.0–47.0)
HEMOGLOBIN: 12 g/dL (ref 12.0–16.0)
MCH: 32.2 pg (ref 26.0–34.0)
MCHC: 34.2 g/dL (ref 32.0–36.0)
MCV: 94.1 fL (ref 80.0–100.0)
PLATELETS: 234 10*3/uL (ref 150–440)
RBC: 3.72 MIL/uL — AB (ref 3.80–5.20)
RDW: 16 % — ABNORMAL HIGH (ref 11.5–14.5)
WBC: 8.1 10*3/uL (ref 3.6–11.0)

## 2017-11-23 NOTE — ED Provider Notes (Addendum)
[facility] Emergency Department Provider Note _____________   First MD Initiated Contact with Patient 11/23/17 2337     (approximate)  I have reviewed the triage vital signs and the nursing notes.   HISTORY  Chief Complaint Vaginal Bleeding    HPI Kelli Richard is a 37 y.o. female below list of chronic medical conditions including partial hysterectomy performed in May 2018 presents to the emergency department with acute onset of postcoital vaginal bleeding today.  Patient states bleeding is consistent with that of a "period".  Patient admits to diffuse abdominal pain extending from the umbilicus.  Patient states current pain score is 10 out of 10.  Patient denies any vomiting or fever   Past Medical History:  Diagnosis Date  . Anemia   . Bilateral occipital neuralgia   . Cervical dysplasia   . Chiari malformation type I ([place])   . Fibromyalgia   . Headache    migraines  . Lipoma   . Thyroid goiter     Patient Active Problem List   Diagnosis Date Noted  . Pelvic pain 11/24/2017  . S/P VH (vaginal hysterectomy) 05/27/2017  . Postoperative state 05/26/2017    Past Surgical History:  Procedure Laterality Date  . BREAST BIOPSY Right 10/06/2014   [place] core biopsy  . CESAREAN SECTION  2013  . LAPAROSCOPIC VAGINAL HYSTERECTOMY WITH SALPINGECTOMY Bilateral 05/26/2017   Procedure: LAPAROSCOPIC ASSISTED VAGINAL HYSTERECTOMY WITH SALPINGECTOMY;  Surgeon: Schermerhorn, Gwen Her, MD;  Location: [facility] ORS;  Service: Gynecology;  Laterality: Bilateral;  . THYROIDECTOMY      Prior to Admission medications   Medication Sig Start Date End Date Taking? Authorizing Provider  baclofen (LIORESAL) 10 MG tablet Take 10 mg by mouth 2 (two) times daily as needed for muscle spasms.   Yes [provider]  cycloSPORINE (RESTASIS) 0.05 % ophthalmic emulsion Place 1 drop into both eyes 2 (two) times daily as needed. For dry eyes. 06/05/16  Yes [provider]  nortriptyline (PAMELOR) 50 MG capsule Take 100 mg by mouth at bedtime.   Yes [provider]  ibuprofen (ADVIL,MOTRIN) 800 MG tablet Take 1 tablet (800 mg total) by mouth every 8 (eight) hours as needed. Patient not taking: Reported on 11/24/2017 05/27/17   Schermerhorn, Gwen Her, MD  ondansetron (ZOFRAN) 4 MG tablet Take 1 tablet (4 mg total) by mouth every 6 (six) hours as needed for nausea. Patient not taking: Reported on 11/24/2017 05/27/17   Schermerhorn, Gwen Her, MD  oxyCODONE-acetaminophen (PERCOCET/ROXICET) 5-325 MG tablet Take 1-2 tablets by mouth every 4 (four) hours as needed (moderate to severe pain (when tolerating fluids)). Patient not taking: Reported on 11/24/2017 05/27/17   Schermerhorn, Gwen Her, MD    Allergies No known drug allergies No family history on file.  Social History Social History   Tobacco Use  . Smoking status: Current Every Day Smoker    Packs/day: 0.25    Years: 1.00    Pack years: 0.25    Types: Cigarettes  . Smokeless tobacco: Never Used  . Tobacco comment: has smoked off and on for 1 year  Substance Use Topics  . Alcohol use: No  . Drug use: Yes    Types: Marijuana    Comment: everyday    Review of Systems Constitutional: No fever/chills Eyes: No visual changes. ENT: No sore throat. Cardiovascular: Denies chest pain. Respiratory: Denies shortness of breath. Gastrointestinal: No abdominal pain.  No nausea, no vomiting.  No diarrhea.  No constipation.  Genitourinary: Positive for pelvic pain and vaginal bleeding Musculoskeletal: Negative for neck pain.  Negative for back pain. Integumentary: Negative for rash. Neurological: Negative for headaches, focal weakness or numbness.   ____________________________________________   PHYSICAL EXAM:  VITAL SIGNS: ED Triage Vitals  Enc Vitals Group     BP 11/23/17 2153 (!) 109/53     Pulse Rate 11/23/17 2153 86     Resp 11/23/17 2153 (!) 22     Temp 11/23/17 2153 97.8 F  (36.6 C)     Temp Source 11/23/17 2153 Oral     SpO2 11/23/17 2153 100 %     Weight 11/23/17 2152 72.6 kg (160 lb)     Height 11/23/17 2152 1.676 m (5\' 6" )     Head Circumference --      Peak Flow --      Pain Score 11/23/17 2152 10     Pain Loc --      Pain Edu? --      Excl. in [place]? --     Constitutional: Alert and oriented.  Apparent discomfort  eyes: Conjunctivae are normal. Head: Atraumatic. Mouth/Throat: Mucous membranes are moist. Oropharynx non-erythematous. Neck: No stridor.   Cardiovascular: Normal rate, regular rhythm. Good peripheral circulation. Grossly normal heart sounds. Respiratory: Normal respiratory effort.  No retractions. Lungs CTAB. Gastrointestinal: Generalized abdominal tenderness with mild palpation.. No distention.  Genitourinary: Mouth moderate vaginal bleeding.  Patient remarkably tender at the 9 o'clock position of the vaginal cuff.  Concern for vaginal tear Musculoskeletal: No lower extremity tenderness nor edema. No gross deformities of extremities. Neurologic:  Normal speech and language. No gross focal neurologic deficits are appreciated.  Skin:  Skin is warm, dry and intact. No rash noted. Psychiatric: Mood and affect are normal. Speech and behavior are normal.  ____________________________________________   LABS (all labs ordered are listed, but only abnormal results are displayed)  Labs Reviewed  COMPREHENSIVE METABOLIC PANEL - Abnormal; Notable for the following components:      Result Value   Potassium 2.9 (*)    CO2 20 (*)    Glucose, Bld 133 (*)    Calcium 8.7 (*)    ALT 11 (*)    All other components within normal limits  CBC - Abnormal; Notable for the following components:   RBC 3.72 (*)    RDW 16.0 (*)    All other components within normal limits  URINALYSIS, COMPLETE (UACMP) WITH MICROSCOPIC - Abnormal; Notable for the following components:   Color, Urine YELLOW (*)    APPearance HAZY (*)    Hgb urine dipstick LARGE (*)     Ketones, ur 5 (*)    Protein, ur 100 (*)    Squamous Epithelial / LPF 0-5 (*)    All other components within normal limits   ____________________  RADIOLOGY I, Blackford N Ilani Otterson, personally viewed and evaluated these images (plain radiographs) as part of my medical decision making, as well as reviewing the written report by the radiologist.  ED MD interpretation: CT abdomen and pelvis revealed pneumoperitoneum.  Findings discussed with Dr. Nevada Crane  Official radiology report(s): Ct Abdomen Pelvis W Contrast  Result Date: 11/24/2017 CLINICAL DATA:  37 year old female with severe abdominal pain. Hysterectomy about 1 year ago. Post coital vaginal pain and bleeding onset today, and on pelvic exam a vaginal cuff tear is identified. EXAM: CT ABDOMEN AND PELVIS WITH CONTRAST TECHNIQUE: Multidetector CT imaging of the abdomen and pelvis was performed using the standard protocol following bolus administration of intravenous  contrast. CONTRAST:  148mL ISOVUE-300 IOPAMIDOL (ISOVUE-300) INJECTION 61% COMPARISON:  Pelvis ultrasound 11/18/2016 and earlier. FINDINGS: Lower chest: Normal lung bases aside from minimal atelectasis in the left costophrenic angle. No pericardial or pleural effusion. Hepatobiliary: Perihepatic free air and trace perihepatic fluid. Liver enhancement is within normal limits. Gallbladder appears within normal limits. Pancreas: Negative. Spleen: Negative. Adrenals/Urinary Tract: Normal adrenal glands. Bilateral renal enhancement is symmetric and within normal limits. There is a small volume of free fluid on the right at Morison's pouch. No perinephric or proximal periureteral stranding. The urinary bladder is distended with an estimated bladder volume of 436 milliliters, but otherwise unremarkable. Stomach/Bowel: Large volume of pneumoperitoneum under the anterior diaphragm, but also there are abundant small foci of gas scattered throughout the peritoneal cavity including around the liver, more  since pouch, occasionally in the small bowel mesentery, and also within the pelvis. There is intermittent retained stool in the colon. The sigmoid segment is moderately redundant. Transverse colon is mildly redundant. There is a roughly equal amount of small pneumoperitoneum scattered along the colonic segments. The appendix contains gas and fluid and tracks to the midline from the cecum without inflammation along its course. The terminal ileum is decompressed. There are fluid-filled small bowel loops in the right lower quadrant, but none are abnormally dilated. The upstream small bowel is decompressed. There is a small volume of fluid and gas in the stomach and duodenum bulb. The remainder of the duodenum is decompressed. No discrete mesenteric inflammation is identified. Vascular/Lymphatic: Major arterial structures in the abdomen and pelvis are patent and appear normal. The portal venous system is patent. No lymphadenopathy. Reproductive: The uterus is surgically absent. There is gas within the vaginal fornix. There is gas around the vaginal cuff which may be intraperitoneal. Cystic, low-density appearance of the ovaries more so the left. Other: There is a fluid containing right inguinal hernia (series 2, image 80 and coronal image 17), but this cannot be connected to any bowel in the abdomen or pelvis. Small volume pelvic free fluid. Musculoskeletal: Negative. IMPRESSION: 1. Positive for pneumoperitoneum, both layering under the diaphragm and with numerous small foci scattered throughout the abdomen and pelvis. However, there is a relatively small volume of associated free fluid which is in both the abdomen and pelvis. Additionally, there is abnormal gas and fluid about the vaginal cuff, which is indistinct. And there is gas within the vaginal fornix. Furthermore, no convincing bowel inflammation or bowel source is identified. This constellation is compatible with a full-thickness tear of the vaginal cuff as the  source of abnormal peritoneal cavity gas. 2. There is a small right inguinal hernia which contains fluid (coronal image 17), and although there are some conspicuous fluid-filled bowel loops in the right lower quadrant, I cannot connect the hernia to any loops of bowel. 3. This study discussed by telephone with Dr. Marjean Donna on 11/24/2017 at 0239 hours. Electronically Signed   By: Genevie Ann M.D.   On: 11/24/2017 02:50    _  .Critical Care Performed by: Gregor Hams, MD Authorized by: Gregor Hams, MD   Critical care provider statement:    Critical care time (minutes):  45   Critical care time was exclusive of:  Separately billable procedures and treating other patients and teaching time   Critical care was necessary to treat or prevent imminent or life-threatening deterioration of the following conditions:  Trauma   Critical care was time spent personally by me on the following activities:  Development  of treatment plan with patient or surrogate, discussions with consultants, evaluation of patient's response to treatment, examination of patient, obtaining history from patient or surrogate, ordering and performing treatments and interventions, ordering and review of laboratory studies, ordering and review of radiographic studies, pulse oximetry, re-evaluation of patient's condition and review of old charts   I assumed direction of critical care for this patient from another provider in my specialty: no       ____________________________________________   INITIAL IMPRESSION / ASSESSMENT AND PLAN / ED COURSE  As part of my medical decision making, I reviewed the following data within the electronic MEDICAL RECORD NUMBER   37 year old female presented with above-stated history and physical exam secondary to abdominal discomfort and vaginal bleeding postcoital.  Concern for vaginal cuff partial or full-thickness tear.  Vaginal exam consistent with a possible partial tear of the vaginal  cuff.  As such patient discussed with Dr. Leafy Ro OB/GYN on-call who will admit the patient for further evaluation and management.  Patient given IV morphine 4 mg in the emergency department.  CT scan was performed given generalized abdominal discomfort with concern for possible peritonitis.  CT scan findings were discussed with Dr. Nevada Crane radiologist who confirmed pneumoperitoneum.  Patient then discussed with Dr. Leafy Ro again who was informed of these findings.  Patient given IV vancomycin and Zosyn.  Patient admitted to the hospital by Dr. Leafy Ro ____________________________________________  FINAL CLINICAL IMPRESSION(S) / ED DIAGNOSES  Final diagnoses:  Laceration of vagina, initial encounter     MEDICATIONS GIVEN DURING THIS VISIT:  Medications  lactated ringers infusion (has no administration in time range)  HYDROmorphone (DILAUDID) injection 0.2-0.6 mg (has no administration in time range)  docusate sodium (COLACE) capsule 100 mg (has no administration in time range)  alum & mag hydroxide-simeth (MAALOX/MYLANTA) 200-200-20 MG/5ML suspension 30 mL (has no administration in time range)  simethicone (MYLICON) chewable tablet 80 mg (has no administration in time range)  ondansetron (ZOFRAN) tablet 4 mg (has no administration in time range)    Or  ondansetron (ZOFRAN) injection 4 mg (has no administration in time range)  menthol-cetylpyridinium (CEPACOL) lozenge 3 mg (has no administration in time range)  0.9 % NaCl with KCl 20 mEq/ L  infusion (has no administration in time range)  vancomycin (VANCOCIN) IVPB 1000 mg/200 mL premix (has no administration in time range)  piperacillin-tazobactam (ZOSYN) IVPB 3.375 g (3.375 g Intravenous Transfusing/Transfer 11/24/17 0301)  morphine 4 MG/ML injection 4 mg (4 mg Intravenous Given 11/24/17 0136)  ondansetron (ZOFRAN) injection 4 mg (4 mg Intravenous Given 11/24/17 0136)  sodium chloride 0.9 % bolus 1,000 mL (0 mLs Intravenous Stopped 11/24/17 0246)   iopamidol (ISOVUE-300) 61 % injection 100 mL (100 mLs Intravenous Contrast Given 11/24/17 0214)     ED Discharge Orders    None       Note:  This document was prepared using Dragon voice recognition software and may include unintentional dictation errors.    Gregor Hams, MD 11/24/17 5732    Gregor Hams, MD 12/10/17 2250

## 2017-11-23 NOTE — ED Triage Notes (Signed)
Patient reports vaginal bleeding and pain that started today.  Reports had a hysterectomy approximately 1 year ago.

## 2017-11-23 NOTE — ED Notes (Signed)
Pt states has lower abd pain and pelvic pain since this pm. Pt states she has blood on "the toilet paper when I wipe". Pt states she believes bleeding is from bladder or vagina.

## 2017-11-24 ENCOUNTER — Encounter: Payer: Self-pay | Admitting: Radiology

## 2017-11-24 ENCOUNTER — Encounter
Admission: EM | Disposition: A | Discharge status: Home / Self Care | Payer: Self-pay | Source: Home / Self Care | Attending: Emergency Medicine

## 2017-11-24 ENCOUNTER — Observation Stay: Payer: Medicaid Other | Admitting: Certified Registered"

## 2017-11-24 ENCOUNTER — Emergency Department: Payer: Medicaid Other

## 2017-11-24 DIAGNOSIS — R102 Pelvic and perineal pain: Secondary | ICD-10-CM | POA: Diagnosis present

## 2017-11-24 HISTORY — PX: LAPAROSCOPY: SHX197

## 2017-11-24 LAB — CBC
HCT: 37.1 % (ref 35.0–47.0)
HEMOGLOBIN: 12 g/dL (ref 12.0–16.0)
MCH: 31.1 pg (ref 26.0–34.0)
MCHC: 32.5 g/dL (ref 32.0–36.0)
MCV: 96 fL (ref 80.0–100.0)
Platelets: 166 10*3/uL (ref 150–440)
RBC: 3.86 MIL/uL (ref 3.80–5.20)
RDW: 16.5 % — ABNORMAL HIGH (ref 11.5–14.5)
WBC: 14.4 10*3/uL — ABNORMAL HIGH (ref 3.6–11.0)

## 2017-11-24 LAB — TYPE AND SCREEN
ABO/RH(D): O POS
Antibody Screen: NEGATIVE

## 2017-11-24 LAB — URINE DRUG SCREEN, QUALITATIVE (ARMC ONLY)
AMPHETAMINES, UR SCREEN: NOT DETECTED
Barbiturates, Ur Screen: NOT DETECTED
Benzodiazepine, Ur Scrn: NOT DETECTED
COCAINE METABOLITE, UR ~~LOC~~: NOT DETECTED
Cannabinoid 50 Ng, Ur ~~LOC~~: POSITIVE — AB
MDMA (Ecstasy)Ur Screen: NOT DETECTED
METHADONE SCREEN, URINE: NOT DETECTED
OPIATE, UR SCREEN: POSITIVE — AB
Phencyclidine (PCP) Ur S: NOT DETECTED
Tricyclic, Ur Screen: POSITIVE — AB

## 2017-11-24 LAB — POTASSIUM: Potassium: 3.7 mmol/L (ref 3.5–5.1)

## 2017-11-24 SURGERY — EXAM UNDER ANESTHESIA
Anesthesia: General | Site: Abdomen

## 2017-11-24 MED ORDER — NORTRIPTYLINE HCL 25 MG PO CAPS
100.0000 mg | ORAL_CAPSULE | Freq: Every day | ORAL | Status: DC
  Filled 2017-11-24 (×2): qty 4

## 2017-11-24 MED ORDER — BACLOFEN 10 MG PO TABS
10.0000 mg | ORAL_TABLET | Freq: Two times a day (BID) | ORAL | Status: DC | PRN
  Filled 2017-11-24: qty 1

## 2017-11-24 MED ORDER — ONDANSETRON HCL 4 MG/2ML IJ SOLN
4.0000 mg | Freq: Once | INTRAMUSCULAR | Status: AC
Start: 2017-11-24 — End: 2017-11-24
  Administered 2017-11-24: 4 mg via INTRAVENOUS

## 2017-11-24 MED ORDER — SODIUM CHLORIDE 0.9 % IV SOLN
2.0000 g | Freq: Four times a day (QID) | INTRAVENOUS | Status: DC
  Administered 2017-11-24: 2 g via INTRAVENOUS
  Filled 2017-11-24 (×5): qty 2000

## 2017-11-24 MED ORDER — SIMETHICONE 80 MG PO CHEW: 80.0000 mg | CHEWABLE_TABLET | Freq: Four times a day (QID) | ORAL | Status: DC | PRN

## 2017-11-24 MED ORDER — ONDANSETRON HCL 4 MG/2ML IJ SOLN
INTRAMUSCULAR | Status: AC
  Filled 2017-11-24: qty 2

## 2017-11-24 MED ORDER — ALUM & MAG HYDROXIDE-SIMETH 200-200-20 MG/5ML PO SUSP: 30.0000 mL | ORAL | Status: DC | PRN

## 2017-11-24 MED ORDER — FENTANYL CITRATE (PF) 100 MCG/2ML IJ SOLN
INTRAMUSCULAR | Status: AC
  Filled 2017-11-24: qty 2

## 2017-11-24 MED ORDER — LACTATED RINGERS IV SOLN
INTRAVENOUS | Status: DC
  Administered 2017-11-24 – 2017-11-25 (×2): via INTRAVENOUS

## 2017-11-24 MED ORDER — DEXAMETHASONE SODIUM PHOSPHATE 10 MG/ML IJ SOLN
INTRAMUSCULAR | Status: DC | PRN
  Administered 2017-11-24: 5 mg via INTRAVENOUS

## 2017-11-24 MED ORDER — HYDROMORPHONE HCL 1 MG/ML IJ SOLN
0.2000 mg | INTRAMUSCULAR | Status: DC | PRN
  Administered 2017-11-24: 0.2 mg via INTRAVENOUS
  Filled 2017-11-24: qty 1

## 2017-11-24 MED ORDER — ONDANSETRON HCL 4 MG/2ML IJ SOLN
INTRAMUSCULAR | Status: DC | PRN
  Administered 2017-11-24: 4 mg via INTRAVENOUS

## 2017-11-24 MED ORDER — ACETAMINOPHEN NICU IV SYRINGE 10 MG/ML
INTRAVENOUS | Status: AC
  Filled 2017-11-24: qty 1

## 2017-11-24 MED ORDER — ONDANSETRON HCL 4 MG PO TABS: 4.0000 mg | ORAL_TABLET | Freq: Four times a day (QID) | ORAL | Status: DC | PRN

## 2017-11-24 MED ORDER — POTASSIUM CHLORIDE IN NACL 20-0.9 MEQ/L-% IV SOLN
INTRAVENOUS | Status: DC
  Administered 2017-11-24: 05:00:00 via INTRAVENOUS
  Filled 2017-11-24 (×5): qty 1000

## 2017-11-24 MED ORDER — PROPOFOL 10 MG/ML IV BOLUS
INTRAVENOUS | Status: DC | PRN
  Administered 2017-11-24: 150 mg via INTRAVENOUS

## 2017-11-24 MED ORDER — DOCUSATE SODIUM 100 MG PO CAPS: 100.0000 mg | ORAL_CAPSULE | Freq: Two times a day (BID) | ORAL | Status: DC

## 2017-11-24 MED ORDER — FENTANYL CITRATE (PF) 100 MCG/2ML IJ SOLN
INTRAMUSCULAR | Status: DC | PRN
  Administered 2017-11-24: 50 ug via INTRAVENOUS
  Administered 2017-11-24: 100 ug via INTRAVENOUS
  Administered 2017-11-24: 50 ug via INTRAVENOUS

## 2017-11-24 MED ORDER — FENTANYL CITRATE (PF) 250 MCG/5ML IJ SOLN
INTRAMUSCULAR | Status: AC
Start: 2017-11-24 — End: 2017-11-24
  Filled 2017-11-24: qty 5

## 2017-11-24 MED ORDER — MIDAZOLAM HCL 2 MG/2ML IJ SOLN
INTRAMUSCULAR | Status: AC
  Filled 2017-11-24: qty 2

## 2017-11-24 MED ORDER — FENTANYL CITRATE (PF) 100 MCG/2ML IJ SOLN: 25.0000 ug | INTRAMUSCULAR | Status: DC | PRN

## 2017-11-24 MED ORDER — ONDANSETRON HCL 4 MG/2ML IJ SOLN: 4.0000 mg | Freq: Four times a day (QID) | INTRAMUSCULAR | Status: DC | PRN

## 2017-11-24 MED ORDER — SODIUM CHLORIDE 0.9 % IV BOLUS (SEPSIS)
1000.0000 mL | Freq: Once | INTRAVENOUS | Status: AC
  Administered 2017-11-24: 1000 mL via INTRAVENOUS

## 2017-11-24 MED ORDER — CLINDAMYCIN PHOSPHATE 900 MG/50ML IV SOLN
900.0000 mg | Freq: Three times a day (TID) | INTRAVENOUS | Status: DC
Start: 2017-11-24 — End: 2017-11-24
  Administered 2017-11-24: 900 mg via INTRAVENOUS
  Filled 2017-11-24 (×4): qty 50

## 2017-11-24 MED ORDER — ROCURONIUM BROMIDE 100 MG/10ML IV SOLN
INTRAVENOUS | Status: DC | PRN
  Administered 2017-11-24: 40 mg via INTRAVENOUS
  Administered 2017-11-24: 10 mg via INTRAVENOUS

## 2017-11-24 MED ORDER — LIDOCAINE HCL (CARDIAC) 20 MG/ML IV SOLN
INTRAVENOUS | Status: DC | PRN
  Administered 2017-11-24: 40 mg via INTRAVENOUS

## 2017-11-24 MED ORDER — MIDAZOLAM HCL 2 MG/2ML IJ SOLN
INTRAMUSCULAR | Status: AC
  Filled 2017-11-24: qty 4

## 2017-11-24 MED ORDER — DOCUSATE SODIUM 100 MG PO CAPS
100.0000 mg | ORAL_CAPSULE | Freq: Two times a day (BID) | ORAL | Status: DC
Start: 2017-11-24 — End: 2017-11-25
  Administered 2017-11-24: 100 mg via ORAL
  Filled 2017-11-24: qty 1

## 2017-11-24 MED ORDER — MENTHOL 3 MG MT LOZG
1.0000 | LOZENGE | OROMUCOSAL | Status: DC | PRN
  Filled 2017-11-24: qty 9

## 2017-11-24 MED ORDER — SUGAMMADEX SODIUM 200 MG/2ML IV SOLN
INTRAVENOUS | Status: AC
  Filled 2017-11-24: qty 2

## 2017-11-24 MED ORDER — PIPERACILLIN-TAZOBACTAM 3.375 G IVPB 30 MIN
3.3750 g | Freq: Once | INTRAVENOUS | Status: DC
  Filled 2017-11-24: qty 50

## 2017-11-24 MED ORDER — PROPOFOL 10 MG/ML IV BOLUS
INTRAVENOUS | Status: AC
  Filled 2017-11-24: qty 20

## 2017-11-24 MED ORDER — GENTAMICIN SULFATE 40 MG/ML IJ SOLN
5.0000 mg/kg | INTRAMUSCULAR | Status: DC
  Administered 2017-11-24: 360 mg via INTRAVENOUS
  Filled 2017-11-24 (×2): qty 9

## 2017-11-24 MED ORDER — MORPHINE SULFATE (PF) 4 MG/ML IV SOLN
4.0000 mg | Freq: Once | INTRAVENOUS | Status: AC
  Administered 2017-11-24: 4 mg via INTRAVENOUS

## 2017-11-24 MED ORDER — SUGAMMADEX SODIUM 200 MG/2ML IV SOLN
INTRAVENOUS | Status: DC | PRN
  Administered 2017-11-24: 120 mg via INTRAVENOUS

## 2017-11-24 MED ORDER — CYCLOSPORINE 0.05 % OP EMUL
1.0000 [drp] | Freq: Two times a day (BID) | OPHTHALMIC | Status: DC | PRN
  Filled 2017-11-24: qty 1

## 2017-11-24 MED ORDER — HYDROMORPHONE HCL 1 MG/ML IJ SOLN: 0.2000 mg | INTRAMUSCULAR | Status: DC | PRN

## 2017-11-24 MED ORDER — MORPHINE SULFATE (PF) 4 MG/ML IV SOLN
INTRAVENOUS | Status: AC
  Filled 2017-11-24: qty 1

## 2017-11-24 MED ORDER — CHEWING GUM (ORBIT) SUGAR FREE
15.0000 | CHEWING_GUM | Freq: Four times a day (QID) | ORAL | Status: DC
  Administered 2017-11-24 – 2017-11-25 (×2): 15 via ORAL
  Filled 2017-11-24: qty 15

## 2017-11-24 MED ORDER — ONDANSETRON HCL 4 MG/2ML IJ SOLN: 4.0000 mg | Freq: Once | INTRAMUSCULAR | Status: DC | PRN

## 2017-11-24 MED ORDER — MIDAZOLAM HCL 2 MG/2ML IJ SOLN
INTRAMUSCULAR | Status: DC | PRN
  Administered 2017-11-24: 2 mg via INTRAVENOUS

## 2017-11-24 MED ORDER — LACTATED RINGERS IV SOLN
INTRAVENOUS | Status: DC
  Administered 2017-11-24: 12:00:00 via INTRAVENOUS

## 2017-11-24 MED ORDER — LACTATED RINGERS IV SOLN
INTRAVENOUS | Status: DC
  Administered 2017-11-24 (×2): via INTRAVENOUS

## 2017-11-24 MED ORDER — DEXAMETHASONE SODIUM PHOSPHATE 10 MG/ML IJ SOLN
INTRAMUSCULAR | Status: AC
  Filled 2017-11-24: qty 1

## 2017-11-24 MED ORDER — ACETAMINOPHEN 10 MG/ML IV SOLN
INTRAVENOUS | Status: DC | PRN
  Administered 2017-11-24: 1000 mg via INTRAVENOUS

## 2017-11-24 MED ORDER — PHENYLEPHRINE HCL 10 MG/ML IJ SOLN
INTRAMUSCULAR | Status: DC | PRN
  Administered 2017-11-24: 100 ug via INTRAVENOUS

## 2017-11-24 MED ORDER — VANCOMYCIN HCL IN DEXTROSE 1-5 GM/200ML-% IV SOLN
1000.0000 mg | Freq: Once | INTRAVENOUS | Status: AC
  Administered 2017-11-24: 1000 mg via INTRAVENOUS
  Filled 2017-11-24: qty 200

## 2017-11-24 MED ORDER — KETOROLAC TROMETHAMINE 30 MG/ML IJ SOLN
INTRAMUSCULAR | Status: AC
  Filled 2017-11-24: qty 1

## 2017-11-24 MED ORDER — IOPAMIDOL (ISOVUE-300) INJECTION 61%
100.0000 mL | Freq: Once | INTRAVENOUS | Status: AC | PRN
  Administered 2017-11-24: 100 mL via INTRAVENOUS

## 2017-11-24 SURGICAL SUPPLY — 62 items
BAG URINE DRAINAGE (UROLOGICAL SUPPLIES) ×3 IMPLANT
BLADE SURG SZ11 CARB STEEL (BLADE) ×3 IMPLANT
CANISTER SUCT 1200ML W/VALVE (MISCELLANEOUS) ×3 IMPLANT
CATH FOLEY 2WAY  5CC 16FR (CATHETERS) ×1
CATH ROBINSON RED A/P 16FR (CATHETERS) ×3 IMPLANT
CATH URTH 16FR FL 2W BLN LF (CATHETERS) ×2 IMPLANT
CHLORAPREP W/TINT 26ML (MISCELLANEOUS) ×3 IMPLANT
DRAPE PERI LITHO V/GYN (MISCELLANEOUS) ×3 IMPLANT
DRAPE SURG 17X11 SM STRL (DRAPES) IMPLANT
DRAPE UNDER BUTTOCK W/FLU (DRAPES) ×3 IMPLANT
DRSG GAUZE PETRO 6X36 STRIP ST (GAUZE/BANDAGES/DRESSINGS) ×3 IMPLANT
DRSG TEGADERM 2-3/8X2-3/4 SM (GAUZE/BANDAGES/DRESSINGS) IMPLANT
ELECT REM PT RETURN 9FT ADLT (ELECTROSURGICAL) ×3
ELECTRODE REM PT RTRN 9FT ADLT (ELECTROSURGICAL) ×2 IMPLANT
GLOVE BIO SURGEON STRL SZ8 (GLOVE) ×9 IMPLANT
GOWN STRL REUS W/ TWL LRG LVL3 (GOWN DISPOSABLE) ×6 IMPLANT
GOWN STRL REUS W/ TWL XL LVL3 (GOWN DISPOSABLE) ×2 IMPLANT
GOWN STRL REUS W/TWL LRG LVL3 (GOWN DISPOSABLE) ×3
GOWN STRL REUS W/TWL XL LVL3 (GOWN DISPOSABLE) ×1
GRASPER SUT TROCAR 14GX15 (MISCELLANEOUS) IMPLANT
IRRIGATION STRYKERFLOW (MISCELLANEOUS) ×2 IMPLANT
IRRIGATOR STRYKERFLOW (MISCELLANEOUS) ×3
IV NS 1000ML (IV SOLUTION) ×1
IV NS 1000ML BAXH (IV SOLUTION) ×2 IMPLANT
KIT PINK PAD W/HEAD ARE REST (MISCELLANEOUS) ×3
KIT PINK PAD W/HEAD ARM REST (MISCELLANEOUS) ×2 IMPLANT
KIT TURNOVER CYSTO (KITS) ×3 IMPLANT
LABEL OR SOLS (LABEL) ×3 IMPLANT
NEEDLE HYPO 22GX1.5 SAFETY (NEEDLE) ×3 IMPLANT
NS IRRIG 500ML POUR BTL (IV SOLUTION) ×3 IMPLANT
OCCLUDER COLPOPNEUMO (BALLOONS) ×3 IMPLANT
PACK BASIN MINOR ARMC (MISCELLANEOUS) ×3 IMPLANT
PACK GYN LAPAROSCOPIC (MISCELLANEOUS) ×3 IMPLANT
PAD OB MATERNITY 4.3X12.25 (PERSONAL CARE ITEMS) ×3 IMPLANT
PAD PREP 24X41 OB/GYN DISP (PERSONAL CARE ITEMS) ×3 IMPLANT
SCISSORS METZENBAUM CVD 33 (INSTRUMENTS) ×3 IMPLANT
SHEARS HARMONIC ACE PLUS 36CM (ENDOMECHANICALS) IMPLANT
SLEEVE ENDOPATH XCEL 5M (ENDOMECHANICALS) ×6 IMPLANT
SLEEVE SCD COMPRESS THIGH MED (MISCELLANEOUS) ×3 IMPLANT
SOL PREP PVP 2OZ (MISCELLANEOUS) ×3
SOLUTION PREP PVP 2OZ (MISCELLANEOUS) ×2 IMPLANT
SPONGE GAUZE 2X2 8PLY STRL LF (GAUZE/BANDAGES/DRESSINGS) ×6 IMPLANT
STRIP CLOSURE SKIN 1/2X4 (GAUZE/BANDAGES/DRESSINGS) IMPLANT
SURGILUBE 2OZ TUBE FLIPTOP (MISCELLANEOUS) ×3 IMPLANT
SUT PDS 2-0 27IN (SUTURE) ×3 IMPLANT
SUT VIC AB 0 CT1 27 (SUTURE) ×1
SUT VIC AB 0 CT1 27XCR 8 STRN (SUTURE) ×2 IMPLANT
SUT VIC AB 0 CT1 36 (SUTURE) ×3 IMPLANT
SUT VIC AB 2-0 SH 27 (SUTURE) ×1
SUT VIC AB 2-0 SH 27XBRD (SUTURE) ×2 IMPLANT
SUT VIC AB 2-0 UR6 27 (SUTURE) ×3 IMPLANT
SUT VIC AB 4-0 SH 27 (SUTURE) ×1
SUT VIC AB 4-0 SH 27XANBCTRL (SUTURE) ×2 IMPLANT
SWABSTK COMLB BENZOIN TINCTURE (MISCELLANEOUS) IMPLANT
SYR 10ML LL (SYRINGE) ×3 IMPLANT
SYR 50ML LL SCALE MARK (SYRINGE) ×3 IMPLANT
SYR CONTROL 10ML (SYRINGE) ×3 IMPLANT
SYRINGE IRR TOOMEY STRL 70CC (SYRINGE) ×6 IMPLANT
TROCAR ENDO BLADELESS 11MM (ENDOMECHANICALS) ×3 IMPLANT
TROCAR XCEL NON-BLD 5MMX100MML (ENDOMECHANICALS) ×3 IMPLANT
TUBING INSUFFLATION (TUBING) ×3 IMPLANT
WATER STERILE IRR 1000ML POUR (IV SOLUTION) ×3 IMPLANT

## 2017-11-24 NOTE — Anesthesia Post-op Follow-up Note (Signed)
Anesthesia QCDR form completed.        

## 2017-11-24 NOTE — H&P (Signed)
Consult History and Physical   SERVICE: Gynecology   Patient Name: Kelli Richard Patient MRN:   [id number]  CC: Vaginal pain and bleeding after intercourse  HPI: Kelli Richard is a 37 y.o. F s/p LAVH in 12/2017 with acute onset pain and bleeding starting just after intercourse. Pt heard a "pop".    Review of Systems: positives in bold GEN:   fevers, chills, weight changes, appetite changes, fatigue, night sweats HEENT:  HA, vision changes, hearing loss, congestion, rhinorrhea, sinus pressure, dysphagia CV:   CP, palpitations PULM:  SOB, cough GI:  abd pain, N/V/D/C GU:  dysuria, urgency, frequency MSK:  arthralgias, myalgias, back pain, swelling SKIN:  rashes, color changes, pallor NEURO:  numbness, weakness, tingling, seizures, dizziness, tremors PSYCH:  depression, anxiety, behavioral problems, confusion  HEME/LYMPH:  easy bruising or bleeding ENDO:  heat/cold intolerance  Past Obstetrical History: OB History   None     Past Gynecologic History: Patient's last menstrual period was 05/07/2017 (exact date).   Past Medical History: Past Medical History:  Diagnosis Date  . Anemia   . Bilateral occipital neuralgia   . Cervical dysplasia   . Chiari malformation type I ([place])   . Fibromyalgia   . Headache    migraines  . Lipoma   . Thyroid goiter     Past Surgical History:   Past Surgical History:  Procedure Laterality Date  . BREAST BIOPSY Right 10/06/2014   [place] core biopsy  . CESAREAN SECTION  2013  . LAPAROSCOPIC VAGINAL HYSTERECTOMY WITH SALPINGECTOMY Bilateral 05/26/2017   Procedure: LAPAROSCOPIC ASSISTED VAGINAL HYSTERECTOMY WITH SALPINGECTOMY;  Surgeon: Schermerhorn, Gwen Her, MD;  Location: [facility] ORS;  Service: Gynecology;  Laterality: Bilateral;  . THYROIDECTOMY      Family History:  family history is not on file.  Social History:  Social History   Socioeconomic History  . Marital status: Single    Spouse name: Not on file  . Number of  children: Not on file  . Years of education: Not on file  . Highest education level: Not on file  Occupational History  . Not on file  Social Needs  . Financial resource strain: Not on file  . Food insecurity:    Worry: Not on file    Inability: Not on file  . Transportation needs:    Medical: Not on file    Non-medical: Not on file  Tobacco Use  . Smoking status: Current Every Day Smoker    Packs/day: 0.25    Years: 1.00    Pack years: 0.25    Types: Cigarettes  . Smokeless tobacco: Never Used  . Tobacco comment: has smoked off and on for 1 year  Substance and Sexual Activity  . Alcohol use: No  . Drug use: Yes    Types: Marijuana    Comment: everyday  . Sexual activity: Not on file  Lifestyle  . Physical activity:    Days per week: Not on file    Minutes per session: Not on file  . Stress: Not on file  Relationships  . Social connections:    Talks on phone: Not on file    Gets together: Not on file    Attends religious service: Not on file    Active member of club or organization: Not on file    Attends meetings of clubs or organizations: Not on file    Relationship status: Not on file  . Intimate partner violence:    Fear of current or ex  partner: Not on file    Emotionally abused: Not on file    Physically abused: Not on file    Forced sexual activity: Not on file  Other Topics Concern  . Not on file  Social History Narrative  . Not on file    Home Medications:  Medications reconciled in EPIC  No current facility-administered medications on file prior to encounter.    Current Outpatient Medications on File Prior to Encounter  Medication Sig Dispense Refill  . baclofen (LIORESAL) 10 MG tablet Take 10 mg by mouth 2 (two) times daily as needed for muscle spasms.    . cycloSPORINE (RESTASIS) 0.05 % ophthalmic emulsion Place 1 drop into both eyes 2 (two) times daily as needed. For dry eyes.    . nortriptyline (PAMELOR) 50 MG capsule Take 100 mg by mouth at  bedtime.    Marland Kitchen ibuprofen (ADVIL,MOTRIN) 800 MG tablet Take 1 tablet (800 mg total) by mouth every 8 (eight) hours as needed. (Patient not taking: Reported on 11/24/2017) 30 tablet 0  . ondansetron (ZOFRAN) 4 MG tablet Take 1 tablet (4 mg total) by mouth every 6 (six) hours as needed for nausea. (Patient not taking: Reported on 11/24/2017) 20 tablet 0  . oxyCODONE-acetaminophen (PERCOCET/ROXICET) 5-325 MG tablet Take 1-2 tablets by mouth every 4 (four) hours as needed (moderate to severe pain (when tolerating fluids)). (Patient not taking: Reported on 11/24/2017) 30 tablet 0    Allergies:  No Known Allergies  Physical Exam:  Temp:  [97.8 F (36.6 C)-98.7 F (37.1 C)] 98.2 F (36.8 C) (03/25 1055) Pulse Rate:  [71-102] 71 (03/25 1055) Resp:  [18-22] 18 (03/25 1055) BP: (105-133)/(53-88) 107/60 (03/25 1055) SpO2:  [99 %-100 %] 100 % (03/25 0750) Weight:  [72.6 kg (160 lb)] 72.6 kg (160 lb) (03/24 2152)   General Appearance:  Well developed, well nourished, no acute distress, alert and oriented x3 HEENT:  Normocephalic atraumatic, extraocular movements intact, moist mucous membranes Cardiovascular:  Normal S1/S2, regular rate and rhythm, no murmurs Pulmonary:  clear to auscultation, no wheezes, rales or rhonchi, symmetric air entry, good air exchange Abdomen:  Bowel sounds present, soft, moderately tender throughout to umbilicus, nondistended, no abnormal masses,  Extremities:  Full range of motion, no pedal edema, 2+ distal pulses, no tenderness Skin:  normal coloration and turgor, no rashes, no suspicious skin lesions noted  Neurologic:  Cranial nerves 2-12 grossly intact, normal muscle tone, strength 5/5 all four extremities Psychiatric:  Normal mood and affect, appropriate, no AH/VH Pelvic:  NEFG, no vulvar masses or lesions, minimal vaginal bleeding   Labs/Studies:   CBC and Coags:  Lab Results  Component Value Date   WBC 8.1 11/23/2017   NEUTOPHILPCT 76.5 03/13/2012   EOSPCT 1.0  03/13/2012   BASOPCT 0.2 03/13/2012   LYMPHOPCT 15.6 03/13/2012   HGB 12.0 11/23/2017   HCT 35.0 11/23/2017   MCV 94.1 11/23/2017   PLT 234 11/23/2017   CMP:  Lab Results  Component Value Date   NA 136 11/23/2017   K 3.7 11/24/2017   CL 104 11/23/2017   CO2 20 (L) 11/23/2017   BUN 9 11/23/2017   CREATININE 0.82 11/23/2017   CREATININE 0.82 05/27/2017   CREATININE 0.76 05/16/2017   PROT 7.1 11/23/2017   BILITOT 0.9 11/23/2017   ALT 11 (L) 11/23/2017   AST 21 11/23/2017   ALKPHOS 67 11/23/2017    Other Imaging: Ct Abdomen Pelvis W Contrast  Result Date: 11/24/2017 CLINICAL DATA:  37 year old female with severe  abdominal pain. Hysterectomy about 1 year ago. Post coital vaginal pain and bleeding onset today, and on pelvic exam a vaginal cuff tear is identified. EXAM: CT ABDOMEN AND PELVIS WITH CONTRAST TECHNIQUE: Multidetector CT imaging of the abdomen and pelvis was performed using the standard protocol following bolus administration of intravenous contrast. CONTRAST:  160mL ISOVUE-300 IOPAMIDOL (ISOVUE-300) INJECTION 61% COMPARISON:  Pelvis ultrasound 11/18/2016 and earlier. FINDINGS: Lower chest: Normal lung bases aside from minimal atelectasis in the left costophrenic angle. No pericardial or pleural effusion. Hepatobiliary: Perihepatic free air and trace perihepatic fluid. Liver enhancement is within normal limits. Gallbladder appears within normal limits. Pancreas: Negative. Spleen: Negative. Adrenals/Urinary Tract: Normal adrenal glands. Bilateral renal enhancement is symmetric and within normal limits. There is a small volume of free fluid on the right at Morison's pouch. No perinephric or proximal periureteral stranding. The urinary bladder is distended with an estimated bladder volume of 436 milliliters, but otherwise unremarkable. Stomach/Bowel: Large volume of pneumoperitoneum under the anterior diaphragm, but also there are abundant small foci of gas scattered throughout the  peritoneal cavity including around the liver, more since pouch, occasionally in the small bowel mesentery, and also within the pelvis. There is intermittent retained stool in the colon. The sigmoid segment is moderately redundant. Transverse colon is mildly redundant. There is a roughly equal amount of small pneumoperitoneum scattered along the colonic segments. The appendix contains gas and fluid and tracks to the midline from the cecum without inflammation along its course. The terminal ileum is decompressed. There are fluid-filled small bowel loops in the right lower quadrant, but none are abnormally dilated. The upstream small bowel is decompressed. There is a small volume of fluid and gas in the stomach and duodenum bulb. The remainder of the duodenum is decompressed. No discrete mesenteric inflammation is identified. Vascular/Lymphatic: Major arterial structures in the abdomen and pelvis are patent and appear normal. The portal venous system is patent. No lymphadenopathy. Reproductive: The uterus is surgically absent. There is gas within the vaginal fornix. There is gas around the vaginal cuff which may be intraperitoneal. Cystic, low-density appearance of the ovaries more so the left. Other: There is a fluid containing right inguinal hernia (series 2, image 80 and coronal image 17), but this cannot be connected to any bowel in the abdomen or pelvis. Small volume pelvic free fluid. Musculoskeletal: Negative. IMPRESSION: 1. Positive for pneumoperitoneum, both layering under the diaphragm and with numerous small foci scattered throughout the abdomen and pelvis. However, there is a relatively small volume of associated free fluid which is in both the abdomen and pelvis. Additionally, there is abnormal gas and fluid about the vaginal cuff, which is indistinct. And there is gas within the vaginal fornix. Furthermore, no convincing bowel inflammation or bowel source is identified. This constellation is compatible  with a full-thickness tear of the vaginal cuff as the source of abnormal peritoneal cavity gas. 2. There is a small right inguinal hernia which contains fluid (coronal image 17), and although there are some conspicuous fluid-filled bowel loops in the right lower quadrant, I cannot connect the hernia to any loops of bowel. 3. This study discussed by telephone with Dr. Marjean Donna on 11/24/2017 at 0239 hours. Electronically Signed   By: Genevie Ann M.D.   On: 11/24/2017 02:50     Assessment / Plan:   Kelli Richard is a 37 y.o. F with concerns for vaginal cuff dehiscence without concern for evisceration, but with free air in the peritoneal cavity.  1.  EUA, dx lap, repair of vaginal cuff 2. NPO 3. Triple abx

## 2017-11-24 NOTE — Anesthesia Preprocedure Evaluation (Addendum)
Anesthesia Evaluation  Patient identified by MRN, date of birth, ID band Patient awake    Reviewed: Allergy & Precautions, H&P , NPO status , Patient's Chart, lab work & pertinent test results, reviewed documented beta blocker date and time   Airway Mallampati: II  TM Distance: >3 FB Neck ROM: full    Dental  (+) Teeth Intact   Pulmonary neg pulmonary ROS, Current Smoker,    Pulmonary exam normal        Cardiovascular negative cardio ROS Normal cardiovascular exam Rhythm:regular Rate:Normal     Neuro/Psych  Headaches,  Neuromuscular disease negative neurological ROS  negative psych ROS   GI/Hepatic negative GI ROS, Neg liver ROS,   Endo/Other  negative endocrine ROS  Renal/GU negative Renal ROS  negative genitourinary   Musculoskeletal   Abdominal   Peds  Hematology negative hematology ROS (+) anemia ,   Anesthesia Other Findings Past Medical History: No date: Anemia No date: Bilateral occipital neuralgia No date: Cervical dysplasia No date: Chiari malformation type I ([place]) No date: Fibromyalgia No date: Headache     Comment:  migraines No date: Lipoma No date: Thyroid goiter Past Surgical History: 10/06/2014: BREAST BIOPSY; Right     Comment:  [place] core biopsy 2013: CESAREAN SECTION 05/26/2017: LAPAROSCOPIC VAGINAL HYSTERECTOMY WITH SALPINGECTOMY;  Bilateral     Comment:  Procedure: LAPAROSCOPIC ASSISTED VAGINAL HYSTERECTOMY               WITH SALPINGECTOMY;  Surgeon: Schermerhorn, Gwen Her, MD;              Location: [facility] ORS;  Service: Gynecology;  Laterality:               Bilateral; No date: THYROIDECTOMY BMI    Body Mass Index:  25.82 kg/m     Reproductive/Obstetrics negative OB ROS                             Anesthesia Physical Anesthesia Plan  ASA: II  Anesthesia Plan: General ETT   Post-op Pain Management:    Induction:   PONV Risk Score and Plan:   Airway  Management Planned:   Additional Equipment:   Intra-op Plan:   Post-operative Plan:   Informed Consent: I have reviewed the patients History and Physical, chart, labs and discussed the procedure including the risks, benefits and alternatives for the proposed anesthesia with the patient or authorized representative who has indicated his/her understanding and acceptance.   Dental Advisory Given  Plan Discussed with: CRNA  Anesthesia Plan Comments:         Anesthesia Quick Evaluation

## 2017-11-24 NOTE — Op Note (Signed)
Lamya S Skeens PROCEDURE DATE: 11/24/2017  PREOPERATIVE DIAGNOSIS: Acute onset pelvic pain and vaginal bleeding with concern for vaginal cuff dehiscence postcoitally  POSTOPERATIVE DIAGNOSIS: Pelvic adhesive disease, bilateral ovarian cysts PROCEDURE: Diagnostic laparoscopy, lysis of adhesions, exam under anesthesia, fluid-distention of vagina and fluid-pressure against vaginal cuff under direction laparoscopic visualization, bubble and colored-fluid into rectum to evaluate integrity, running the small and large bowel  SURGEON:  Dr. Benjaman Kindler ASSISTANT: Dr. Laverta Baltimore IntraOp consult with general surgery: Dr. Jacobo Forest ANESTHESIOLOGIST: No responsible provider has been recorded for the case. Anesthesiologist: Gunnar Bulla, MD CRNA: Dionne Bucy, CRNA; Allean Found, CRNA  INDICATIONS: 37 y.o. F presenting with acute onset postcoital pelvic pain and vaginal bleeding, desiring surgical evaluation.   Please see preoperative notes for further details.  Risks of surgery were discussed with the patient including but not limited to: bleeding which may require transfusion or reoperation; infection which may require antibiotics; injury to bowel, bladder, ureters or other surrounding organs; need for additional procedures including laparotomy; thromboembolic phenomenon, incisional problems and other postoperative/anesthesia complications. Written informed consent was obtained.    FINDINGS:  Left ovarian hemorrhagic cyst x3, right ovarian simple cyst, left ovarian torsion for a complete turn. No uterus with well-healed vaginal cuff with one long adhesion between vaginal cuff and sigmoid colon. Moderate amount of serosanguinous fluid in pelvis. No other abdominal/pelvic abnormality.  Normal upper abdomen. No small or large bowel abnormality. Please see gen surgery's note for further details.  ANESTHESIA:    General INTRAVENOUS FLUIDS: see anesthesia's note ESTIMATED BLOOD LOSS: 25  ml URINE OUTPUT: 350 ml SPECIMENS: None COMPLICATIONS: None immediate  PROCEDURE IN DETAIL:  The patient had sequential compression devices applied to her lower extremities while in the preoperative area.  She was then taken to the operating room where general anesthesia was administered and was found to be adequate.  She was placed in the dorsal lithotomy position, and was prepped and draped in a sterile manner.  After an adequate timeout was performed, A Foley catheter was inserted into her bladder and attached to constant drainage and a uterine manipulator was then advanced into the uterus .  Visualization of the vaginal cuff found a well-healed scar with a small perineal tear that was hemostatic. No vaginal bleeding noted and no evidence of cuff lost integrity noted.   Attention was turned to the abdomen where an umbilical incision was made with the scalpel.  The Optiview 5-mm trocar and sleeve were then advanced without difficulty with the laparoscope under direct visualization into the abdomen.  The abdomen was then insufflated with carbon dioxide gas and adequate pneumoperitoneum was obtained.   A detailed survey of the patient's pelvis and abdomen revealed the findings as mentioned above.  An additional 53mm trocar was placed in the bilower quadrants under direct visualization.   The left ovary was de-torsed on its pedicle, and no dusky areas remained. Excellent bright red blood flow was noted. No edema.  Ovarian cystectomy The ovarian cysts were evaluated and dissected out from the normal ovarian tissue. Hemostasis was assured with electrocautery. The pelvis was irrigated and all fluid and blood removed. The patient was placed in reverse Trendelenburg and all fluid removed.  A small adhesion was noted between the vaginal cuff and sigmoid colon, and this was sharply excised using monopolar scissors. The vagina was filled with methylene blue dyed saline and occluded, and no leakage was noted in  the pelvis.   A toomy syringe was filled with Hibiclens and  bubbles, and methylene blue dye; this was instilled into the anus and no leakage or bubbles were noted in the pelvis under direct visualization.  The small blood in the pelvis was irrigated, and the hemorrhagic cysts on the left ovary were ruptured and cauterized for excellent hemostasis.  At this point, general surgery was called to consider running the bowel to see if we could determine where the significant amount of peritoneal air was coming from. Please see Dr. Herbert Spires note for details. No further pelvic or abdominal abnormalities were noted.   The operative site was surveyed, and it was found to be hemostatic.  No intraoperative injury to surrounding organs was noted.  Pictures were taken of the quadrants and pelvis. The abdomen was desufflated and all instruments were then removed from the patient's abdomen.  All incisions were closed with Dermabond.   The foley cath was removed.   The patient tolerated the procedures well.  All instruments, needles, and sponge counts were correct x 2. The patient was taken to the recovery room in stable condition.

## 2017-11-24 NOTE — ED Notes (Signed)
Delay taking pt to floor. Awaiting pt's mother to come and pick up 2 minor children that are with pt.

## 2017-11-24 NOTE — Progress Notes (Signed)
Patient transported to OR at this time.     Fransheska Willingham Garner, RN 

## 2017-11-24 NOTE — Transfer of Care (Signed)
Immediate Anesthesia Transfer of Care Note  Patient: Kelli Richard  Procedure(s) Performed: EXAM UNDER ANESTHESIA (N/A ) LAPAROSCOPY DIAGNOSTIC, EXAMINATION OF LARGE AND SMALL BOWEL, LYSIS OF ADHESIONS, DRAIN BILATERAL OVARIAN CYST, VAGINAL AND RECTAL EXAM (N/A Abdomen)  Patient Location: PACU  Anesthesia Type:General  Level of Consciousness: sedated  Airway & Oxygen Therapy: Patient Spontanous Breathing and Patient connected to face mask oxygen  Post-op Assessment: Report given to RN and Post -op Vital signs reviewed and stable  Post vital signs: Reviewed and stable  Last Vitals:  Vitals Value Taken Time  BP 101/42 11/24/2017  7:02 PM  Temp 36.1 C 11/24/2017  7:02 PM  Pulse 93 11/24/2017  7:04 PM  Resp 15 11/24/2017  7:05 PM  SpO2 100 % 11/24/2017  7:04 PM  Vitals shown include unvalidated device data.  Last Pain:  Vitals:   11/24/17 1055  TempSrc: Oral  PainSc:          Complications: No apparent anesthesia complications

## 2017-11-24 NOTE — Anesthesia Procedure Notes (Signed)
Procedure Name: Intubation Date/Time: 11/24/2017 4:05 PM Performed by: Allean Found, CRNA Pre-anesthesia Checklist: Patient identified, Emergency Drugs available, Suction available, Patient being monitored and Timeout performed Patient Re-evaluated:Patient Re-evaluated prior to induction Oxygen Delivery Method: Circle system utilized Preoxygenation: Pre-oxygenation with 100% oxygen Induction Type: IV induction Ventilation: Mask ventilation without difficulty Laryngoscope Size: Mac and 3 Grade View: Grade I Tube type: Oral Tube size: 7.5 mm Number of attempts: 1 Airway Equipment and Method: Stylet Placement Confirmation: ETT inserted through vocal cords under direct vision,  positive ETCO2 and breath sounds checked- equal and bilateral Secured at: 21 cm Tube secured with: Tape Dental Injury: Teeth and Oropharynx as per pre-operative assessment

## 2017-11-24 NOTE — Anesthesia Postprocedure Evaluation (Signed)
Anesthesia Post Note  Patient: Kelli Richard  Procedure(s) Performed: EXAM UNDER ANESTHESIA (N/A ) LAPAROSCOPY DIAGNOSTIC, EXAMINATION OF LARGE AND SMALL BOWEL, LYSIS OF ADHESIONS, DRAIN BILATERAL OVARIAN CYST, VAGINAL AND RECTAL EXAM (N/A Abdomen)  Patient location during evaluation: PACU Anesthesia Type: General Level of consciousness: awake and alert Pain management: pain level controlled Vital Signs Assessment: post-procedure vital signs reviewed and stable Respiratory status: spontaneous breathing, nonlabored ventilation, respiratory function stable and patient connected to nasal cannula oxygen Cardiovascular status: blood pressure returned to baseline and stable Postop Assessment: no apparent nausea or vomiting Anesthetic complications: no     Last Vitals:  Vitals:   11/24/17 1950 11/24/17 1955  BP:  (!) 109/55  Pulse: 73 70  Resp: 16 12  Temp:    SpO2: 100% 100%    Last Pain:  Vitals:   11/24/17 1945  TempSrc:   PainSc: 0-No pain                 Randie Bloodgood S

## 2017-11-24 NOTE — ED Notes (Signed)
Pt sleeping in bed in no acute distress.

## 2017-11-24 NOTE — Discharge Instructions (Signed)
Laparoscopic Ovarian Surgery Discharge Instructions ° °For the next three days, take ibuprofen and acetaminophen on a schedule, every 8 hours. You can take them together or you can intersperse them, and take one every four hours. I also gave you gabapentin for nighttime, to help you sleep and also to control pain. Take gabapentin medicines at night for at least the next 3 nights. You also have a narcotic, oxycodone, to take as needed if the above medicines don't help. ° °Postop constipation is a major cause of pain. Stay well hydrated, walk as you tolerate, and take over the counter senna as well as stool softeners if you need them. ° ° °RISKS AND COMPLICATIONS  °· Infection. °· Bleeding. °· Injury to surrounding organs. °· Anesthetic side effects. ° ° °PROCEDURE  °· You may be given a medicine to help you relax (sedative) before the procedure. You will be given a medicine to make you sleep (general anesthetic) during the procedure. °· A tube will be put down your throat to help your breath while under general anesthesia. °· Several small cuts (incisions) are made in the lower abdominal area and one incision is made near the belly button. °· Your abdominal area will be inflated with a safe gas (carbon dioxide). This helps give the surgeon room to operate, visualize, and helps the surgeon avoid other organs. °· A thin, lighted tube (laparoscope) with a camera attached is inserted into your abdomen through the incision near the belly button. Other small instruments may also be inserted through other abdominal incisions. °· The ovary is located and are removed. °· After the ovary is removed, the gas is released from the abdomen. °· The incisions will be closed with stitches (sutures), and Dermabond. A bandage may be placed over the incisions. ° °AFTER THE PROCEDURE  °· You will also have some mild abdominal discomfort for 3-7 days. You will be given pain medicine to ease any discomfort. °· As long as there are no  problems, you may be allowed to go home. Someone will need to drive you home and be with you for at least 24 hours once home. °· You may have some mild discomfort in the throat. This is from the tube placed in your throat while you were sleeping. °· You may experience discomfort in the shoulder area from some trapped air between the liver and diaphragm. This sensation is normal and will slowly go away on its own. ° °HOME CARE INSTRUCTIONS  °· Take all medicines as directed. °· Only take over-the-counter or prescription medicines for pain, discomfort, or fever as directed by your caregiver. °· Resume daily activities as directed. °· Showers are preferred over baths for 2 weeks. °· You may resume sexual activities in 1 week or as you feel you would like to. °· Do not drive while taking narcotics. ° °SEEK MEDICAL CARE IF: . °· There is increasing abdominal pain. °· You feel lightheaded or faint. °· You have the chills. °· You have an oral temperature above 102° F (38.9° C). °· There is pus-like (purulent) drainage from any of the wounds. °· You are unable to pass gas or have a bowel movement. °· You feel sick to your stomach (nauseous) or throw up (vomit) and can't control it with your medicines. ° °MAKE SURE YOU:  °· Understand these instructions. °· Will watch your condition. °· Will get help right away if you are not doing well or get worse. ° °[employer]® Patient Information ©2013 [employer]. ° ° ° ° °  Here is a helpful article from the website [employer], regarding constipation ° °Here are reasons why constipation occurs after surgery: °1) During the operation and in the recovery room, most people are given opioid pain medication, primarily through an IV, to treat moderate or severe pain. Intravenous opioids include morphine, Dilaudid and fentanyl. After surgery, patients are often prescribed opioid pain medication to take by mouth at home, including codeine, Vicodin®, Norco®, and Percocet®. All of these  medications cause constipation by slowing down the movement of your intestine. °2) Changes in your diet before surgery can be another culprit. It is common to get specific instructions to change how you normally eat or drink before your surgery, like only having liquids the day before or not having anything to eat or drink after midnight the night before surgery. For this reason, temporary dehydration may occur. This, along with not eating or only having liquids, means that you are getting less fiber than usual. Both these factors contribute to constipation. °3) Changes in your diet after surgery can also contribute to the problem. Although many people don’t have dietary restrictions after operations, being under anesthesia can make you lose your appetite for several hours and maybe even days. Some people can even have nausea or vomiting. Not eating or drinking normally means that you are not getting enough fiber and you can get dehydrated, both leading to constipation. °4) Lying in a bed more than usual--which happens before, during and after surgery--combined with the medications and diet changes, all work together to slow down your colon and make your poop turn to rock. ° °No one likes to be constipated.  °Let’s face it, it’s not a pleasant feeling when you don’t poop for days, then strain on the toilet to finally pass something large enough to cause damage. An ounce of prevention is worth a pound of cure, so: °1. Assume you will be constipated. °2. Plan and prepare accordingly. °Post-surgery is one of those unique situations where the temporary use of laxatives can make a world of difference. Always consult with your doctor, and recognize that if you wait several days after surgery to take a laxative, the constipation might be too severe for these over-the-counter options. It is always important to discuss all medications you plan on taking with your doctor. Ask your doctor if you can start the laxative  immediately after surgery. * ° °Here are go-to post-surgery laxatives: °Senna: Senna is an herb that acts as a “stimulant laxative,” meaning it increases the activity of the intestine to cause you to have a bowel movement. It comes in many forms, but senna pills are easy to take and are sold over the counter at almost all pharmacies. Since opioid pain medications slow down the activity of the intestine, it makes sense to take a medication to help reverse that side effect. Long-term use of a stimulant laxative is not a good idea since it can make your colon “lazy” and not function properly; however, temporary use immediately after surgery is acceptable. In general, if you are able to eat a normal diet, taking senna soon after surgery works the best. Senna usually works within hours to produce a bowel movement, but this is less predictable when you are taking different medications after surgery. Try not to wait several days to start taking senna, as often it is too late by then. Just like with all medications or supplements, check with your doctor before starting new treatment.  ° °Magnesium: Magnesium is an important mineral   that our body needs. We get magnesium from some foods that we eat, especially foods that are high in fiber such as broccoli, almonds and whole grains. There are also magnesium-based medications used to treat constipation including milk of magnesia (magnesium hydroxide), magnesium citrate and magnesium oxide. They work by drawing water into the intestine, putting it into the class of “osmotic” laxatives. Magnesium products in low doses appear to be safe, but if taken in very large doses, can lead to problems such as irregular heartbeat, low blood pressure and even death. It can also affect other medications you might be taking, therefore it is important to discuss using magnesium with your physician and pharmacist before initiating therapy. Most over-the-counter magnesium laxatives work very well  to help with the constipation related to surgery, but sometimes they work too well and lead to diarrhea. Make sure you are somewhere with easy access to a bathroom, just in case.  ° °Bisacodyl: Bisacodyl (generic name) is sold under brand names such as Dulcolax®. Much like senna, it is a “stimulant laxative,” meaning it makes your intestines move more quickly to push out the stool. This is another good choice to start taking as soon as your doctor says you can take a laxative after surgery. It comes in pill form and as a suppository, which is a good choice for people who cannot or are not allowed to swallow pills. Studies have shown that it works as a laxative, but like most of these medications, you should use this on a short-term basis only. °  °Enema: Enemas strike fear in many people, but FEAR NOT! It’s nowhere near as big a deal as you may think. An enema is just a way to get some liquid into your rectum by placing a specially designed device through your anus. If you have never done one, it might seem like a painful, unpleasant, uncomfortable, complicated and lengthy procedure. But in reality, it’s simple, takes just a few seconds and is highly effective. The small ready-made bottles you buy at the pharmacy are much easier than the hose/large rubber container type. Those recommended positions illustrated in some instructions are generally not necessary to place the enema. It’s very similar to the insertion of a tampon, requiring a slight squat. Some extra lubrication on the enema’s tip (or on your anus) will make it a breeze. In certain cases, there is no substitute for a good enema. For example, if someone has not pooped for a few days, the beginning of the poop waiting to come out can become rock hard. Passing that hard stool can lead to much pain and problems like anal fissures. Inserting a little liquid to break up the rock-hard stool will help make its passage much easier. Enemas come with different  liquids. Most come with saline, but there are also mineral oil options. You can also use warm water in the reusable enema containers. They all work. But since saline can sometimes be irritating, so try a mineral oil or water enema instead. ° °Here are commonly recommended constipation medications that do not work well for post-surgery constipation: °Docusate: Docusate (generic name) most commonly referred to as Colace® (brand name) is not really a laxative, but is classified as a stool softener. Although this medication is commonly prescribed, it is not recommended for several reasons: 1) there is no good medical evidence that it works 2) even if it has an effect, which is very questionable, it is minimal and cannot combat the intestinal slowing caused by   the opioid medications. Skip docusate to save money and space in your pillbox for something more effective.  °PEG: Miralax® (brand name) is basically a chemical called polyethylene glycol (PEG) and it has gained tremendous popularity as a laxative. This product is an “osmotic laxative” meaning it works by pulling water into the stool, making it softer. This is very similar to the action of natural fiber in foods and supplements. Therefore, the effect seen by this medication is not immediate, causing a bowel movement in a day or more. Is this medication strong enough to battle the constipation related to having an operation? Maybe for some people not prone to constipation. But for most people, other laxatives are better to prevent constipation after surgery. ° °

## 2017-11-24 NOTE — ED Notes (Signed)
Ed tech in with md to perform pelvic exam.

## 2017-11-25 ENCOUNTER — Encounter: Payer: Self-pay | Admitting: Obstetrics and Gynecology

## 2017-11-25 LAB — CBC
HEMATOCRIT: 33.9 % — AB (ref 35.0–47.0)
HEMOGLOBIN: 11.1 g/dL — AB (ref 12.0–16.0)
MCH: 31.4 pg (ref 26.0–34.0)
MCHC: 32.8 g/dL (ref 32.0–36.0)
MCV: 95.8 fL (ref 80.0–100.0)
Platelets: 174 10*3/uL (ref 150–440)
RBC: 3.54 MIL/uL — AB (ref 3.80–5.20)
RDW: 16.1 % — ABNORMAL HIGH (ref 11.5–14.5)
WBC: 9.3 10*3/uL (ref 3.6–11.0)

## 2017-11-25 LAB — BASIC METABOLIC PANEL
ANION GAP: 9 (ref 5–15)
BUN: 6 mg/dL (ref 6–20)
CHLORIDE: 107 mmol/L (ref 101–111)
CO2: 21 mmol/L — AB (ref 22–32)
Calcium: 8.2 mg/dL — ABNORMAL LOW (ref 8.9–10.3)
Creatinine, Ser: 0.74 mg/dL (ref 0.44–1.00)
GFR calc Af Amer: >60 mL/min (ref >60)
GFR calc non Af Amer: >60 mL/min (ref >60)
Glucose, Bld: 84 mg/dL (ref 65–99)
Potassium: 3.9 mmol/L (ref 3.5–5.1)
Sodium: 137 mmol/L (ref 135–145)

## 2017-11-25 MED ORDER — OXYCODONE HCL 5 MG PO CAPS: 5.0000 mg | ORAL_CAPSULE | Freq: Four times a day (QID) | ORAL | 0 refills | Status: DC | PRN

## 2017-11-25 MED ORDER — ACETAMINOPHEN 500 MG PO TABS
1000.0000 mg | ORAL_TABLET | Freq: Four times a day (QID) | ORAL | Status: DC
  Administered 2017-11-25: 1000 mg via ORAL
  Filled 2017-11-25: qty 2

## 2017-11-25 MED ORDER — GABAPENTIN 300 MG PO CAPS: 900.0000 mg | ORAL_CAPSULE | Freq: Every day | ORAL | Status: DC

## 2017-11-25 MED ORDER — METOCLOPRAMIDE HCL 10 MG PO TABS: 10.0000 mg | ORAL_TABLET | Freq: Four times a day (QID) | ORAL | Status: DC | PRN

## 2017-11-25 MED ORDER — CELECOXIB 200 MG PO CAPS
200.0000 mg | ORAL_CAPSULE | Freq: Two times a day (BID) | ORAL | Status: DC
  Administered 2017-11-25: 200 mg via ORAL
  Filled 2017-11-25 (×2): qty 1

## 2017-11-25 MED ORDER — GABAPENTIN 800 MG PO TABS: 800.0000 mg | ORAL_TABLET | Freq: Every day | ORAL | 0 refills | Status: DC

## 2017-11-25 MED ORDER — DOCUSATE SODIUM 100 MG PO CAPS: 100.0000 mg | ORAL_CAPSULE | Freq: Two times a day (BID) | ORAL | 0 refills | Status: DC

## 2017-11-25 MED ORDER — OXYCODONE HCL 5 MG PO TABS
5.0000 mg | ORAL_TABLET | ORAL | Status: DC | PRN
  Administered 2017-11-25: 5 mg via ORAL
  Filled 2017-11-25: qty 1

## 2017-11-25 MED ORDER — ACETAMINOPHEN 500 MG PO TABS: 1000.0000 mg | ORAL_TABLET | Freq: Four times a day (QID) | ORAL | 0 refills | Status: AC

## 2017-11-25 NOTE — Final Progress Note (Signed)
1 Day Post-Op       Procedure(s): EXAM UNDER ANESTHESIA (N/A) LAPAROSCOPY DIAGNOSTIC, EXAMINATION OF LARGE AND SMALL BOWEL, LYSIS OF ADHESIONS, DRAIN BILATERAL OVARIAN CYST, VAGINAL AND RECTAL EXAM (N/A) Subjective: The patient is doing well.  No nausea or vomiting. Pain is adequately controlled.  Objective: Vital signs in last 24 hours: Temp:  [96.8 F (36 C)-98.8 F (37.1 C)] 98.5 F (36.9 C) (03/26 0826) Pulse Rate:  [70-103] 90 (03/26 0826) Resp:  [12-20] 20 (03/26 0826) BP: (94-112)/(42-60) 112/59 (03/26 0826) SpO2:  [99 %-100 %] 99 % (03/26 0826)  Intake/Output  Intake/Output Summary (Last 24 hours) at 11/25/2017 0844 Last data filed at 11/25/2017 0653 Gross per 24 hour  Intake 1980 ml  Output 2350 ml  Net -370 ml    Physical Exam:  General: Alert and oriented. CV: RRR Lungs: Clear bilaterally. GI: Soft, Nondistended. Incisions: Clean and dry. Urine: Clear, Foley in place Extremities: Nontender, no erythema, no edema.  Lab Results: Recent Labs    11/23/17 2159 11/24/17 1132 11/25/17 0656  HGB 12.0 12.0 11.1*  HCT 35.0 37.1 33.9*  WBC 8.1 14.4* 9.3  PLT 234 166 174                 Results for orders placed or performed during the hospital encounter of 11/23/17 (from the past 24 hour(s))  Potassium     Status: None   Collection Time: 11/24/17  9:33 AM  Result Value Ref Range   Potassium 3.7 3.5 - 5.1 mmol/L  Urine Drug Screen, Qualitative ([facility] only)     Status: Abnormal   Collection Time: 11/24/17  9:48 AM  Result Value Ref Range   Tricyclic, Ur Screen POSITIVE (A) NONE DETECTED   Amphetamines, Ur Screen NONE DETECTED NONE DETECTED   MDMA (Ecstasy)Ur Screen NONE DETECTED NONE DETECTED   Cocaine Metabolite,Ur [place] NONE DETECTED NONE DETECTED   Opiate, Ur Screen POSITIVE (A) NONE DETECTED   Phencyclidine (PCP) Ur S NONE DETECTED NONE DETECTED   Cannabinoid 50 Ng, Ur  POSITIVE (A) NONE DETECTED   Barbiturates, Ur Screen NONE DETECTED NONE DETECTED   Benzodiazepine, Ur Scrn NONE DETECTED NONE DETECTED   Methadone Scn, Ur NONE DETECTED NONE DETECTED  CBC     Status: Abnormal   Collection Time: 11/24/17 11:32 AM  Result Value Ref Range   WBC 14.4 (H) 3.6 - 11.0 K/uL   RBC 3.86 3.80 - 5.20 MIL/uL   Hemoglobin 12.0 12.0 - 16.0 g/dL   HCT 37.1 35.0 - 47.0 %   MCV 96.0 80.0 - 100.0 fL   MCH 31.1 26.0 - 34.0 pg   MCHC 32.5 32.0 - 36.0 g/dL   RDW 16.5 (H) 11.5 - 14.5 %   Platelets 166 150 - 440 K/uL  Type and screen [place]     Status: None   Collection Time: 11/24/17 11:32 AM  Result Value Ref Range   ABO/RH(D) O POS    Antibody Screen NEG    Sample Expiration      11/27/2017 Performed at [facility], [address] [place], [place] [address]   CBC     Status: Abnormal   Collection Time: 11/25/17  6:56 AM  Result Value Ref Range   WBC 9.3 3.6 - 11.0 K/uL   RBC 3.54 (L) 3.80 - 5.20 MIL/uL   Hemoglobin 11.1 (L) 12.0 - 16.0 g/dL   HCT 33.9 (L) 35.0 - 47.0 %   MCV 95.8 80.0 - 100.0 fL  MCH 31.4 26.0 - 34.0 pg   MCHC 32.8 32.0 - 36.0 g/dL   RDW 16.1 (H) 11.5 - 14.5 %   Platelets 174 150 - 440 K/uL  Basic metabolic panel     Status: Abnormal   Collection Time: 11/25/17  6:56 AM  Result Value Ref Range   Sodium 137 135 - 145 mmol/L   Potassium 3.9 3.5 - 5.1 mmol/L   Chloride 107 101 - 111 mmol/L   CO2 21 (L) 22 - 32 mmol/L   Glucose, Bld 84 65 - 99 mg/dL   BUN 6 6 - 20 mg/dL   Creatinine, Ser 0.74 0.44 - 1.00 mg/dL   Calcium 8.2 (L) 8.9 - 10.3 mg/dL   GFR calc non Af Amer >60 >60 mL/min   GFR calc Af Amer >60 >60 mL/min   Anion gap 9 5 - 15    Assessment/Plan: 1 Day Post-Op       Procedure(s): EXAM UNDER ANESTHESIA (N/A) LAPAROSCOPY DIAGNOSTIC, EXAMINATION OF LARGE AND SMALL BOWEL, LYSIS OF ADHESIONS, DRAIN BILATERAL OVARIAN CYST, VAGINAL AND RECTAL EXAM (N/A)  Pt admitted with concern for vaginal cuff dehiscence but not found to have a communicating passage vaginally, rectally or  any bowel abnormalities.  Her WBC remained normal - stop IV abx, now on for >24hrs, without a treatment goal - advance diet to clears and advance as tolerated - plan to repeat CT scan in 14 days - abdominal binder  1) Ambulate, Incentive spirometry 2) Advance diet as tolerated 3) Transition to oral pain medication 5) Discharge home today possible if no ileus, pain controlled, no other sx   Benjaman Kindler, MD   LOS: 0 days   Benjaman Kindler 11/25/2017, 8:44 AM

## 2017-11-25 NOTE — Consult Note (Signed)
INTRAOPERATIVE CONSULT:  REASON FOR CONSULT: Free air on CT scan without clear gyn cause, r/o bowel perforation  HPI: The patient is a 37 yo female 6 months s/p hysterectomy with vaginal cuff who presented with acute onset postcoital pelvic pain and vaginal bleeding. CT scan revealed free air in the abdominal cavity. Vaginal cuff dehiscence was suspected. At the time of presentation, the patient complained of vaginal and pelvic pain. Abdomen was reportedly soft and moderately tender to palpation in the lower abdomen bilaterally. Vital signs were normal. WBC was normal. Intraoperatively, when no clear gyn source for fee air could be identified, general surgery was asked to evaluate the remainder of the abdomen.  Past Medical History:  Diagnosis Date  . Anemia   . Bilateral occipital neuralgia   . Cervical dysplasia   . Chiari malformation type I ([place])   . Fibromyalgia   . Headache    migraines  . Lipoma   . Thyroid goiter    Past Surgical History:  Procedure Laterality Date  . BREAST BIOPSY Right 10/06/2014   [place] core biopsy  . CESAREAN SECTION  2013  . LAPAROSCOPIC VAGINAL HYSTERECTOMY WITH SALPINGECTOMY Bilateral 05/26/2017   Procedure: LAPAROSCOPIC ASSISTED VAGINAL HYSTERECTOMY WITH SALPINGECTOMY;  Surgeon: Schermerhorn, Gwen Her, MD;  Location: [facility] ORS;  Service: Gynecology;  Laterality: Bilateral;  . LAPAROSCOPY N/A 11/24/2017   Procedure: LAPAROSCOPY DIAGNOSTIC, EXAMINATION OF LARGE AND SMALL BOWEL, LYSIS OF ADHESIONS, DRAIN BILATERAL OVARIAN CYST, VAGINAL AND RECTAL EXAM;  Surgeon: Benjaman Kindler, MD;  Location: [facility] ORS;  Service: Gynecology;  Laterality: N/A;  . THYROIDECTOMY     Social History   Tobacco Use  . Smoking status: Current Every Day Smoker    Packs/day: 0.25    Years: 1.00    Pack years: 0.25    Types: Cigarettes  . Smokeless tobacco: Never Used  . Tobacco comment: has smoked off and on for 1 year  Substance Use Topics  . Alcohol use: No  . Drug use: Yes     Types: Marijuana    Comment: everyday   Current Outpatient Medications on File Prior to Encounter  Medication Sig Dispense Refill  . baclofen (LIORESAL) 10 MG tablet Take 10 mg by mouth 2 (two) times daily as needed for muscle spasms.    . cycloSPORINE (RESTASIS) 0.05 % ophthalmic emulsion Place 1 drop into both eyes 2 (two) times daily as needed. For dry eyes.    . nortriptyline (PAMELOR) 50 MG capsule Take 100 mg by mouth at bedtime.    Marland Kitchen ibuprofen (ADVIL,MOTRIN) 800 MG tablet Take 1 tablet (800 mg total) by mouth every 8 (eight) hours as needed. (Patient not taking: Reported on 11/24/2017) 30 tablet 0  . ondansetron (ZOFRAN) 4 MG tablet Take 1 tablet (4 mg total) by mouth every 6 (six) hours as needed for nausea. (Patient not taking: Reported on 11/24/2017) 20 tablet 0  . oxyCODONE-acetaminophen (PERCOCET/ROXICET) 5-325 MG tablet Take 1-2 tablets by mouth every 4 (four) hours as needed (moderate to severe pain (when tolerating fluids)). (Patient not taking: Reported on 11/24/2017) 30 tablet 0    Allergies:  No Known Allergies   ROS: Unable to obtain intraoperatively.  PHYSICAL EXAM: Unable to obtain intraoperatively. See opnote for intraoperative findings.  Labs/Studies:   CBC and Coags:        RecentLabs       Lab Results  Component Value Date   WBC 8.1 11/23/2017   NEUTOPHILPCT 76.5 03/13/2012   EOSPCT 1.0 03/13/2012   BASOPCT  0.2 03/13/2012   LYMPHOPCT 15.6 03/13/2012   HGB 12.0 11/23/2017   HCT 35.0 11/23/2017   MCV 94.1 11/23/2017   PLT 234 11/23/2017     CMP:    RecentLabs       Lab Results  Component Value Date   NA 136 11/23/2017   K 3.7 11/24/2017   CL 104 11/23/2017   CO2 20 (L) 11/23/2017   BUN 9 11/23/2017   CREATININE 0.82 11/23/2017   CREATININE 0.82 05/27/2017   CREATININE 0.76 05/16/2017   PROT 7.1 11/23/2017   BILITOT 0.9 11/23/2017   ALT 11 (L) 11/23/2017   AST 21 11/23/2017   ALKPHOS 67 11/23/2017       Other Imaging:  ImagingResults    ASSESSMENT/PLAN: 37 yo female 6 months s/p hysterectomy with vaginal cuff who presented with acute onset postcoital pelvic pain and vaginal bleeding. CT scan revealed free air in the abdominal cavity. Although vaginal cuff dehiscence was suspected, the cuff appeared intact during diagnostic laparoscopy. Upon inspection, the bowel appeared normal and no GI source could be identified as the cause of the pneumoperitoneum. Observe the patient postoperatively with serial abdominal exams as well as frequent vital signs and laboratory evaluation. Return to the OR if clinical status worsens.  Thank you for the opportunity to care for this patient in collaboration.

## 2017-11-25 NOTE — Op Note (Signed)
Procedure date: 11/24/2017  PREOPERATIVE DIAGNOSIS: Acute onset pelvic pain and vaginal bleeding with concern for vaginal cuff dehiscence postcoitally.  POSTOPERATIVE DIAGNOSIS: Pelvic adhesive disease, bilateral ovarian cysts.  PROCEDURE: Diagnostic laparoscopy with lysis of adhesions and examination of vagina and rectum under anesthesia performed by Dr. Leafy Ro. Continuation of diagnostic laparoscopy with complete evaluation of the small and large bowel following intraoperative consult performed by Dr. Elbert Ewings.  SURGEONS: Dr. Benjaman Kindler and Dr. Jacobo Forest ASSISTANT: Dr. Marcello Moores Schermerhorn ANESTHESIOLOGIST: Dr. Gunnar Bulla CRNA: Dionne Bucy, CRNA; Allean Found, CRNA  INDICATIONS: 37 yo female 6 months s/p hysterectomy with vaginal cuff presented with acute onset postcoital pelvic pain and vaginal bleeding. CT scan revealed free air in the abdominal cavity. Vaginal cuff dehiscence was suspected. When no clear gyn source for fee air could be identified, general surgery was asked to evaluate the remainder of the abdomen intraoperatively.    FINDINGS: Normal-appearing small bowel, large bowel, and appendix without evidence of disease or perforation. Normal-appearing gallbladder and liver. Please see Dr. Migdalia Dk operative note for the remainder of the findings during her portion of the procedure.  ANESTHESIA:    General INTRAVENOUS FLUIDS: see anesthesia's note ESTIMATED BLOOD LOSS: 25 ml URINE OUTPUT: 350 ml SPECIMENS: None COMPLICATIONS: None immediate  DESCRIPTION OF PROCEDURE: Please see Dr. Migdalia Dk operative note for details on her portion of the procedure. Following Dr. Migdalia Dk diagnostic laparoscopy with adhesiolysis and ovarian cystectomy, I was asked to evaluate the remainder of the abdomen for a source of intraperitoneal free-air seen on CT scan. Through the existing laparoscopic ports, the entire small and large bowel was evaluated. The small bowel appeared pink  with good peristalsis. The large bowel also appeared normal. The appendix appeared normal and non-diseased. The liver and gallbladder also appeared normal. There was no enteric fluid or fecal material noted in the abdomen. There were no pockets of abscess or areas of fibrinous reaction encountered. All evaluated organs appeared normal and without disease.   The pelvis was again evaluated. The operative site was surveyed and found to be hemostatic. The abdomen was desufflated, all instruments and ports were removed and incisions were closed with skin glue. The foley catheter was removed.  All instrument and sponge counts were correct at the conclusion of the procedure.  The patient was taken to the PACU in good condition.

## 2017-11-25 NOTE — Progress Notes (Signed)
Pt doing well s/p dx laparoscopy AVSS Taking PO  PE NAD Abd: soft, incisions c/d/i,. No peritonitis  A/p No surgical issues We will be available

## 2017-11-25 NOTE — Progress Notes (Signed)
Pt discharged home.  Discharge instructions, prescriptions and follow up appointment given to and reviewed with pt.  Pt verbalized understanding.  Escorted by auxillary. 

## 2017-11-26 ENCOUNTER — Other Ambulatory Visit: Payer: Self-pay | Admitting: Obstetrics and Gynecology

## 2017-11-26 DIAGNOSIS — K668 Other specified disorders of peritoneum: Secondary | ICD-10-CM

## 2017-12-04 ENCOUNTER — Ambulatory Visit
Admission: RE | Admit: 2017-12-04 | Discharge: 2017-12-04 | Disposition: A | Discharge status: Home / Self Care | Payer: Medicaid Other | Source: Ambulatory Visit | Attending: Obstetrics and Gynecology | Admitting: Obstetrics and Gynecology

## 2017-12-04 DIAGNOSIS — K668 Other specified disorders of peritoneum: Secondary | ICD-10-CM

## 2017-12-04 MED ORDER — IOHEXOL 300 MG/ML  SOLN
100.0000 mL | Freq: Once | INTRAMUSCULAR | Status: AC | PRN
  Administered 2017-12-04: 100 mL via INTRAVENOUS

## 2018-08-11 LAB — HM HIV SCREENING LAB: HM HIV Screening: NEGATIVE

## 2019-01-26 IMAGING — CT CT ABD-PELV W/ CM
2 of 5 series · 13 of 46 positions shown, 15 images · IV contrast (omnipaque)
Comparison: 11/24/2017

CLINICAL DATA: Hysterectomy 05/26/2017. Laparoscopy on 11/24/2017.
Free intraperitoneal air on prior exam. Re-evaluate. Abdominal
soreness.

EXAM:
CT ABDOMEN AND PELVIS WITH CONTRAST
TECHNIQUE: Multidetector CT imaging of the abdomen and pelvis was performed
using the standard protocol following bolus administration of
intravenous contrast.
CONTRAST:  100mL OMNIPAQUE IOHEXOL 300 MG/ML  SOLN

[Series 2: abd pelvis · axial · 0.74mm/px · z∈[-1446,-1066]mm · 10 of 94 slices shown, 12 images (1 of 2)]
[im 9/94  soft-tissue]
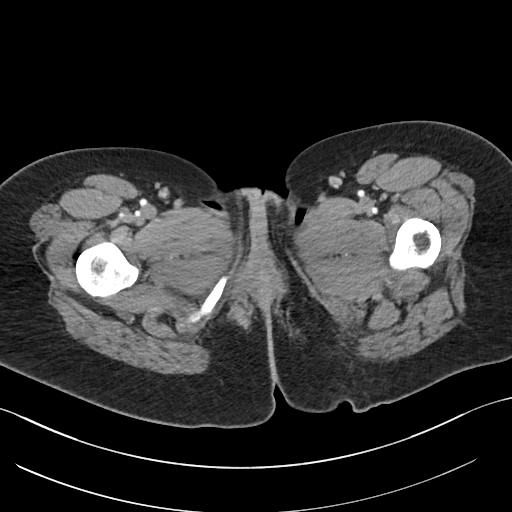
[im 9/94  bone]
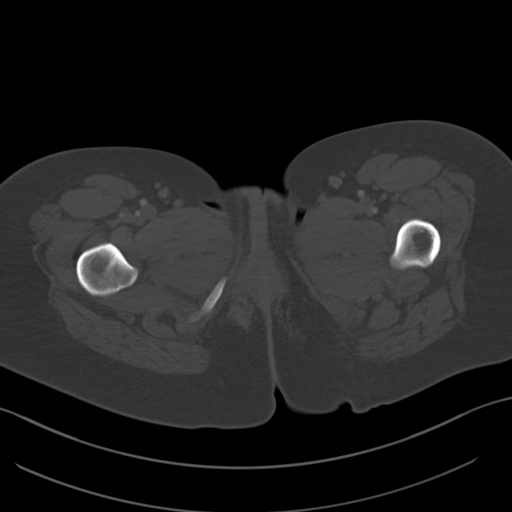
[im 17/94  soft-tissue]
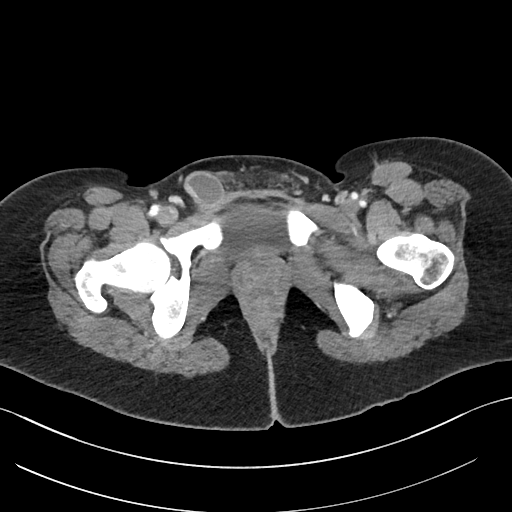
[im 26/94  soft-tissue]
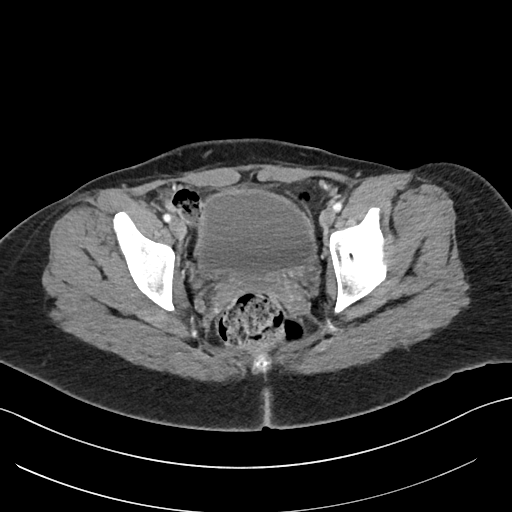
[im 34/94  soft-tissue]
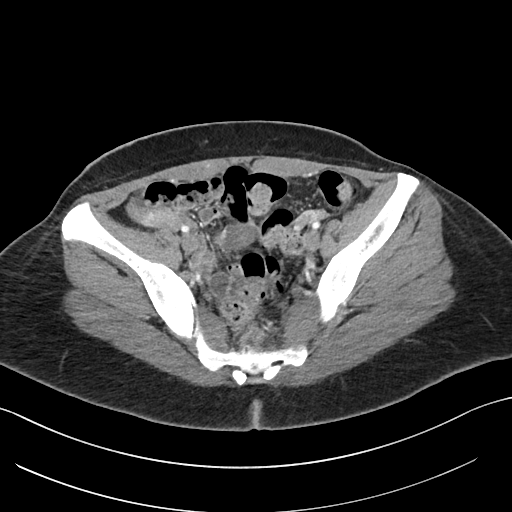
[im 43/94  soft-tissue]
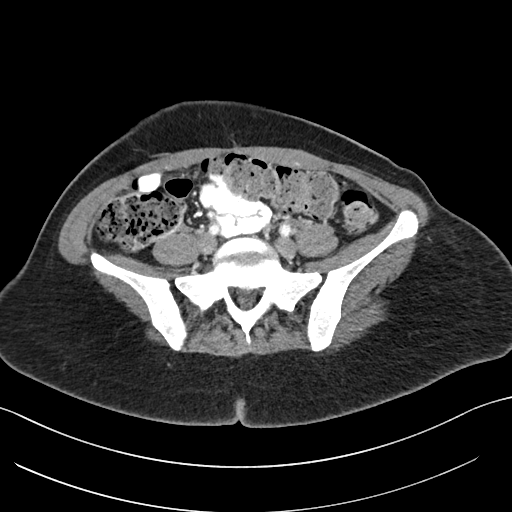
[im 51/94  soft-tissue]
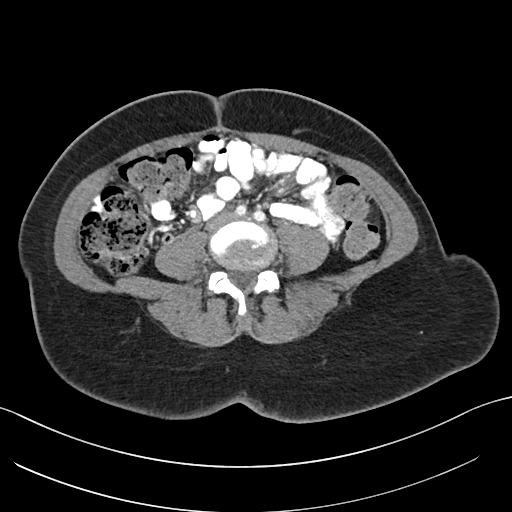
[im 60/94  soft-tissue]
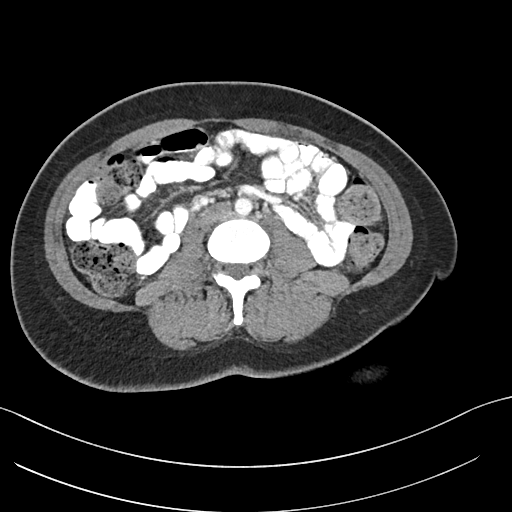
[im 68/94  soft-tissue]
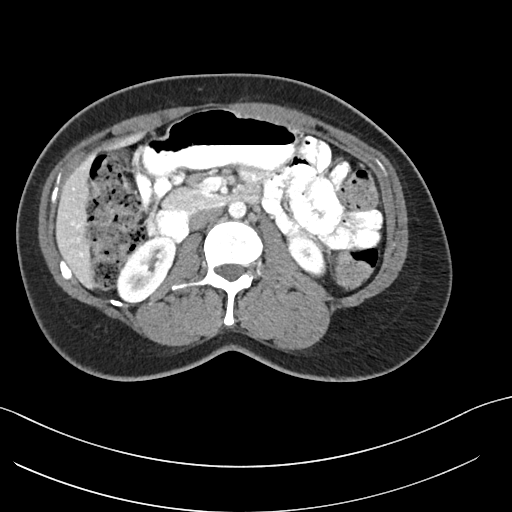
[im 77/94  soft-tissue]
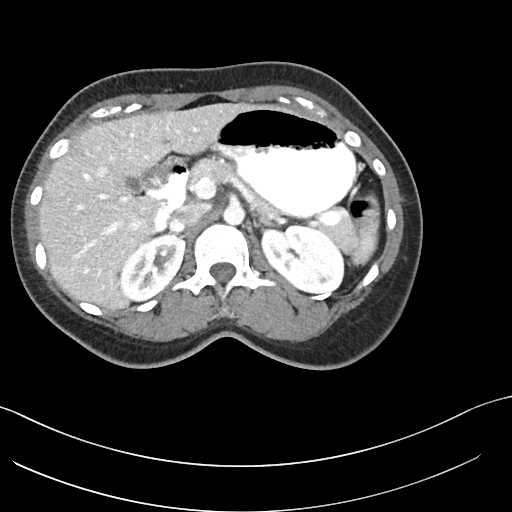
[im 77/94  bone]
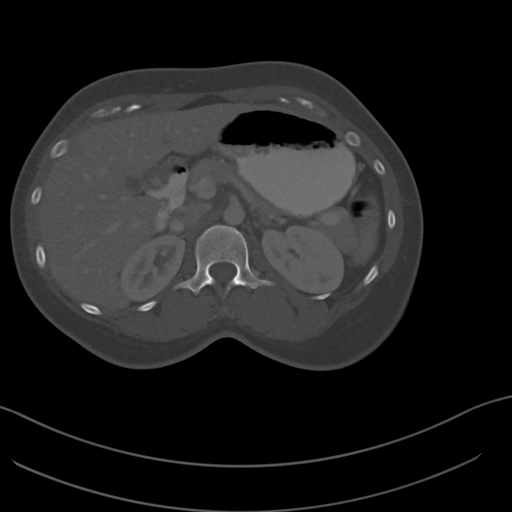
[im 85/94  soft-tissue]
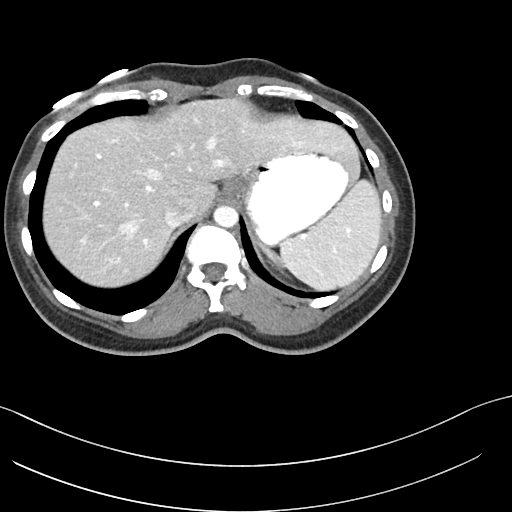

[Series 10: abd pelvis · coronal · 0.74mm/px · 3 of 149 slices shown (2 of 2)]
[im 50/149  soft-tissue]
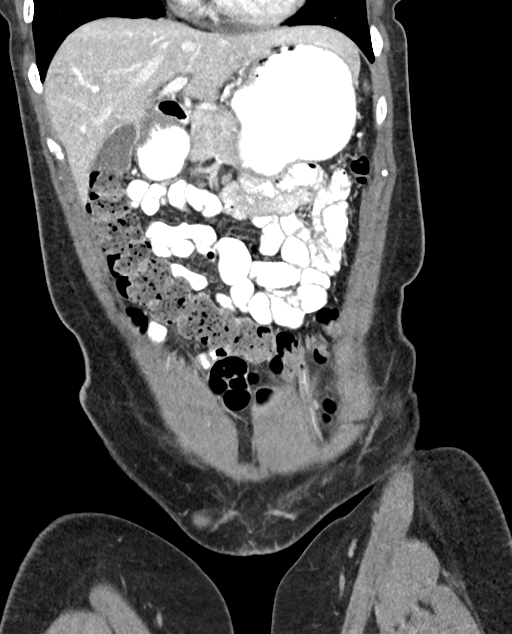
[im 66/149  soft-tissue]
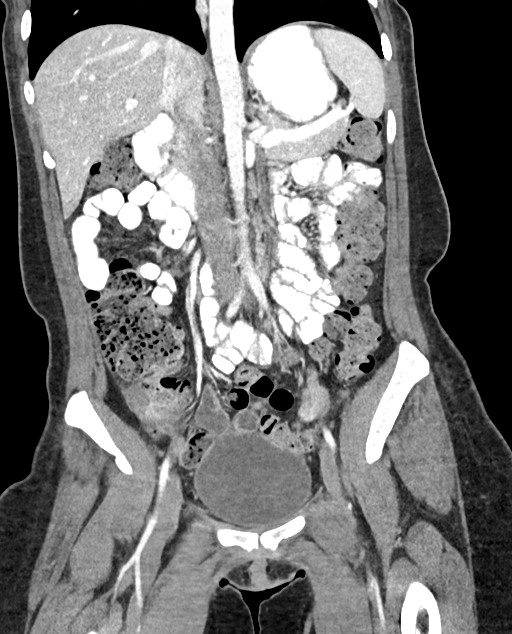
[im 83/149  soft-tissue]
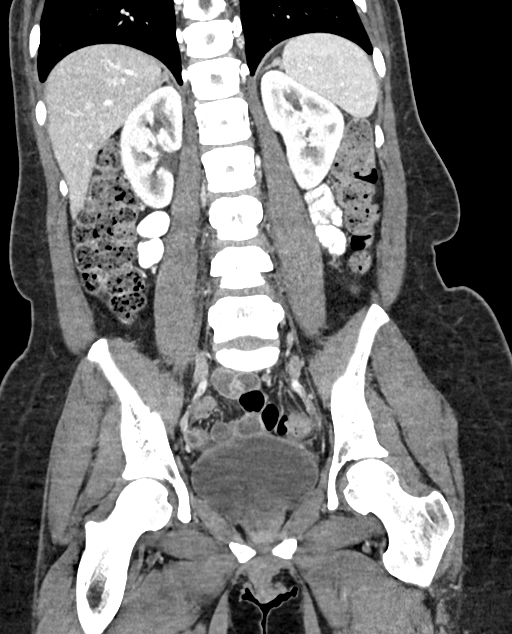

[13 of 46 positions shown; findings below may reference images not displayed]

FINDINGS: Lower chest: Clear lung bases. Normal heart size without pericardial
or pleural effusion.

Hepatobiliary: High right hepatic lobe area of irregular enhancement
measures 10 mm on image [DATE], favored to represent a hemangioma.

Normal gallbladder, without biliary ductal dilatation.

Pancreas: Normal, without mass or ductal dilatation.

Spleen: Normal in size, without focal abnormality.

Adrenals/Urinary Tract: Normal adrenal glands. Normal kidneys,
without hydronephrosis. Normal urinary bladder.

Stomach/Bowel: Normal stomach, without wall thickening. Colonic
stool burden suggests constipation. Normal terminal ileum and
appendix. Normal small bowel.

Vascular/Lymphatic: Normal caliber of the aorta and branch vessels.
No abdominopelvic adenopathy.

Reproductive: Hysterectomy.  No adnexal mass.

Other: No significant free fluid. Resolution of free intraperitoneal
air. Redemonstration of fluid within the right inguinal canal,
including on image 78/2.

Musculoskeletal: No acute osseous abnormality.
IMPRESSION: 1. No acute process in the abdomen or pelvis. Resolution of free
intraperitoneal air and fluid since 11/24/2017.
[DATE].  Possible constipation.
3. Nonspecific fluid within the right inguinal canal, similar.

## 2019-02-10 ENCOUNTER — Other Ambulatory Visit: Payer: Self-pay | Admitting: Family Medicine

## 2019-02-10 DIAGNOSIS — E041 Nontoxic single thyroid nodule: Secondary | ICD-10-CM

## 2019-02-18 ENCOUNTER — Ambulatory Visit
Admission: RE | Admit: 2019-02-18 | Discharge: 2019-02-18 | Disposition: A | Discharge status: Home / Self Care | Payer: Medicaid Other | Source: Ambulatory Visit | Attending: Family Medicine | Admitting: Family Medicine

## 2019-02-18 ENCOUNTER — Other Ambulatory Visit: Payer: Self-pay

## 2019-02-18 DIAGNOSIS — E041 Nontoxic single thyroid nodule: Secondary | ICD-10-CM | POA: Diagnosis present

## 2019-04-29 DIAGNOSIS — Z62810 Personal history of physical and sexual abuse in childhood: Secondary | ICD-10-CM

## 2019-04-30 ENCOUNTER — Encounter: Payer: Self-pay | Admitting: Physician Assistant

## 2019-04-30 ENCOUNTER — Ambulatory Visit: Payer: Medicaid Other | Admitting: Physician Assistant

## 2019-04-30 ENCOUNTER — Other Ambulatory Visit: Payer: Self-pay

## 2019-04-30 DIAGNOSIS — Z113 Encounter for screening for infections with a predominantly sexual mode of transmission: Secondary | ICD-10-CM

## 2019-04-30 DIAGNOSIS — B9689 Other specified bacterial agents as the cause of diseases classified elsewhere: Secondary | ICD-10-CM

## 2019-04-30 DIAGNOSIS — N760 Acute vaginitis: Secondary | ICD-10-CM

## 2019-04-30 MED ORDER — METRONIDAZOLE 500 MG PO TABS: 500.0000 mg | ORAL_TABLET | Freq: Two times a day (BID) | ORAL | 0 refills | Status: DC
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
## 2019-05-03 LAB — WET PREP FOR TRICH, YEAST, CLUE
Trichomonas Exam: NEGATIVE
Yeast Exam: NEGATIVE

## 2019-05-04 LAB — GONOCOCCUS CULTURE

## 2019-07-07 ENCOUNTER — Ambulatory Visit
Admission: EM | Admit: 2019-07-07 | Discharge: 2019-07-07 | Disposition: A | Discharge status: Home / Self Care | Payer: Medicaid Other

## 2019-07-07 ENCOUNTER — Other Ambulatory Visit: Payer: Self-pay

## 2019-07-07 ENCOUNTER — Encounter: Payer: Self-pay | Admitting: Emergency Medicine

## 2019-07-07 DIAGNOSIS — M5441 Lumbago with sciatica, right side: Secondary | ICD-10-CM

## 2019-07-07 MED ORDER — PREDNISONE 10 MG (21) PO TBPK: ORAL_TABLET | ORAL | 0 refills | Status: DC

## 2019-07-07 MED ORDER — CYCLOBENZAPRINE HCL 10 MG PO TABS: 10.0000 mg | ORAL_TABLET | Freq: Two times a day (BID) | ORAL | 0 refills | Status: DC | PRN

## 2019-07-07 NOTE — Discharge Instructions (Addendum)
Sending you for x rays tomorrow  to the outpatient center I will call you when I get your results.  Take the prednisone daily with food.  Flexeril for muscle relaxant

## 2019-07-07 NOTE — ED Triage Notes (Signed)
Patient in office today c/o has been complaining of back and right leg pain x 1wk  Stated she does have nerve damage   OTC:
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
## 2019-07-08 ENCOUNTER — Ambulatory Visit
Admission: RE | Admit: 2019-07-08 | Discharge: 2019-07-08 | Disposition: A | Discharge status: Home / Self Care | Payer: Medicaid Other | Attending: Family Medicine | Admitting: Family Medicine

## 2019-07-08 ENCOUNTER — Telehealth: Payer: Self-pay | Admitting: Family Medicine

## 2019-07-08 ENCOUNTER — Ambulatory Visit
Admission: RE | Admit: 2019-07-08 | Discharge: 2019-07-08 | Disposition: A | Discharge status: Home / Self Care | Payer: Medicaid Other | Source: Ambulatory Visit | Attending: Family Medicine | Admitting: Family Medicine

## 2019-07-08 ENCOUNTER — Other Ambulatory Visit: Payer: Self-pay

## 2019-07-08 DIAGNOSIS — M545 Low back pain: Secondary | ICD-10-CM | POA: Insufficient documentation

## 2019-07-08 DIAGNOSIS — R200 Anesthesia of skin: Secondary | ICD-10-CM | POA: Insufficient documentation

## 2019-07-08 DIAGNOSIS — R202 Paresthesia of skin: Secondary | ICD-10-CM | POA: Insufficient documentation

## 2019-07-08 NOTE — Telephone Encounter (Signed)
Changed x-ray as requested

## 2019-12-16 ENCOUNTER — Other Ambulatory Visit: Payer: Self-pay | Admitting: Family Medicine

## 2019-12-16 DIAGNOSIS — Z1231 Encounter for screening mammogram for malignant neoplasm of breast: Secondary | ICD-10-CM

## 2019-12-30 ENCOUNTER — Other Ambulatory Visit: Payer: Self-pay

## 2019-12-30 ENCOUNTER — Encounter: Payer: Self-pay | Admitting: Advanced Practice Midwife

## 2019-12-30 ENCOUNTER — Ambulatory Visit: Payer: Medicaid Other | Admitting: Advanced Practice Midwife

## 2019-12-30 DIAGNOSIS — N760 Acute vaginitis: Secondary | ICD-10-CM | POA: Diagnosis not present

## 2019-12-30 DIAGNOSIS — A599 Trichomoniasis, unspecified: Secondary | ICD-10-CM | POA: Diagnosis not present

## 2019-12-30 DIAGNOSIS — Z113 Encounter for screening for infections with a predominantly sexual mode of transmission: Secondary | ICD-10-CM | POA: Diagnosis not present

## 2019-12-30 DIAGNOSIS — F325 Major depressive disorder, single episode, in full remission: Secondary | ICD-10-CM | POA: Insufficient documentation

## 2019-12-30 DIAGNOSIS — G43909 Migraine, unspecified, not intractable, without status migrainosus: Secondary | ICD-10-CM | POA: Insufficient documentation

## 2019-12-30 DIAGNOSIS — M797 Fibromyalgia: Secondary | ICD-10-CM | POA: Insufficient documentation

## 2019-12-30 LAB — WET PREP FOR TRICH, YEAST, CLUE
Trichomonas Exam: POSITIVE — AB
Yeast Exam: NEGATIVE

## 2019-12-30 MED ORDER — METRONIDAZOLE 500 MG PO TABS: 500.0000 mg | ORAL_TABLET | Freq: Two times a day (BID) | ORAL | 0 refills | Status: DC
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
## 2019-12-30 NOTE — Addendum Note (Signed)
Addended by: Rich Number on: 12/30/2019 04:58 PM   Modules accepted: Orders

## 2019-12-30 NOTE — Progress Notes (Signed)
Wet mount reviewed by Ola Spurr CNM and needs treatment for BV and trich. Treated per standing order. Rich Number, RN

## 2020-01-03 LAB — GONOCOCCUS CULTURE

## 2020-01-13 ENCOUNTER — Ambulatory Visit
Admission: RE | Admit: 2020-01-13 | Discharge: 2020-01-13 | Disposition: A | Discharge status: Home / Self Care | Payer: Medicaid Other | Source: Ambulatory Visit | Attending: Family Medicine | Admitting: Family Medicine

## 2020-01-13 DIAGNOSIS — Z1231 Encounter for screening mammogram for malignant neoplasm of breast: Secondary | ICD-10-CM | POA: Insufficient documentation

## 2020-01-18 NOTE — Addendum Note (Signed)
Addended by: Caryl Bis on: 01/18/2020 04:06 PM   Modules accepted: Orders

## 2020-02-10 ENCOUNTER — Other Ambulatory Visit: Payer: Self-pay

## 2020-02-10 ENCOUNTER — Ambulatory Visit: Payer: Medicaid Other | Admitting: Physician Assistant

## 2020-02-10 ENCOUNTER — Encounter: Payer: Self-pay | Admitting: Physician Assistant

## 2020-02-10 DIAGNOSIS — Z113 Encounter for screening for infections with a predominantly sexual mode of transmission: Secondary | ICD-10-CM | POA: Diagnosis not present

## 2020-02-10 DIAGNOSIS — Z299 Encounter for prophylactic measures, unspecified: Secondary | ICD-10-CM

## 2020-02-10 LAB — WET PREP FOR TRICH, YEAST, CLUE
Trichomonas Exam: NEGATIVE
Yeast Exam: NEGATIVE

## 2020-02-10 MED ORDER — CLOTRIMAZOLE 1 % VA CREA: 1.0000 | TOPICAL_CREAM | Freq: Every day | VAGINAL | 0 refills | Status: AC
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
## 2020-04-03 ENCOUNTER — Other Ambulatory Visit: Payer: Self-pay

## 2020-04-03 ENCOUNTER — Encounter: Payer: Self-pay | Admitting: Advanced Practice Midwife

## 2020-04-03 ENCOUNTER — Ambulatory Visit: Payer: Medicaid Other | Admitting: Advanced Practice Midwife

## 2020-04-03 DIAGNOSIS — N760 Acute vaginitis: Secondary | ICD-10-CM

## 2020-04-03 DIAGNOSIS — Z113 Encounter for screening for infections with a predominantly sexual mode of transmission: Secondary | ICD-10-CM | POA: Diagnosis not present

## 2020-04-03 DIAGNOSIS — B9689 Other specified bacterial agents as the cause of diseases classified elsewhere: Secondary | ICD-10-CM

## 2020-04-03 DIAGNOSIS — F1290 Cannabis use, unspecified, uncomplicated: Secondary | ICD-10-CM | POA: Insufficient documentation

## 2020-04-03 MED ORDER — METRONIDAZOLE 500 MG PO TABS: 500.0000 mg | ORAL_TABLET | Freq: Two times a day (BID) | ORAL | 0 refills | Status: AC
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
## 2020-04-03 NOTE — Progress Notes (Signed)
Wet mount reviewed with provider, patient treated for BV per Glory Buff, RN

## 2020-04-03 NOTE — Progress Notes (Signed)
Patient here for STD testing. States she had a hysterectomy in 2018.Marland KitchenJenetta Downer, RN

## 2020-04-04 LAB — WET PREP FOR TRICH, YEAST, CLUE
Trichomonas Exam: NEGATIVE
Yeast Exam: NEGATIVE

## 2020-04-06 LAB — HEPATITIS B SURFACE ANTIGEN

## 2020-04-06 LAB — HM HIV SCREENING LAB: HM HIV Screening: NEGATIVE

## 2020-04-06 LAB — HM HEPATITIS C SCREENING LAB: HM Hepatitis Screen: NEGATIVE

## 2020-04-07 ENCOUNTER — Encounter: Payer: Self-pay | Admitting: Family Medicine

## 2020-04-08 LAB — GONOCOCCUS CULTURE

## 2020-05-24 ENCOUNTER — Encounter: Payer: Self-pay | Admitting: Family Medicine

## 2020-05-24 ENCOUNTER — Other Ambulatory Visit: Payer: Self-pay

## 2020-05-24 ENCOUNTER — Ambulatory Visit: Payer: Medicaid Other | Admitting: Family Medicine

## 2020-05-24 DIAGNOSIS — Z202 Contact with and (suspected) exposure to infections with a predominantly sexual mode of transmission: Secondary | ICD-10-CM | POA: Diagnosis not present

## 2020-05-24 DIAGNOSIS — N760 Acute vaginitis: Secondary | ICD-10-CM

## 2020-05-24 DIAGNOSIS — Z113 Encounter for screening for infections with a predominantly sexual mode of transmission: Secondary | ICD-10-CM | POA: Diagnosis not present

## 2020-05-24 DIAGNOSIS — B9689 Other specified bacterial agents as the cause of diseases classified elsewhere: Secondary | ICD-10-CM

## 2020-05-24 LAB — WET PREP FOR TRICH, YEAST, CLUE
Trichomonas Exam: NEGATIVE
Yeast Exam: NEGATIVE

## 2020-05-24 MED ORDER — METRONIDAZOLE 500 MG PO TABS: 500.0000 mg | ORAL_TABLET | Freq: Two times a day (BID) | ORAL | 0 refills | Status: AC

## 2020-05-24 MED ORDER — METRONIDAZOLE 500 MG PO TABS: 500.0000 mg | ORAL_TABLET | Freq: Once | ORAL | 0 refills | Status: DC

## 2020-05-24 NOTE — Progress Notes (Signed)
Wet mount reviewed with provider and pt treated for BV and re-treated for Trich with Metronidazole 500mg  (#14), 1 tab po BID x7 days per Hassell Done, FNP verbal order. Counseled pt per provider orders and pt states understanding. Provider orders completed.
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
## 2020-05-29 LAB — GONOCOCCUS CULTURE

## 2020-07-21 ENCOUNTER — Encounter: Payer: Self-pay | Admitting: Advanced Practice Midwife

## 2020-07-21 ENCOUNTER — Other Ambulatory Visit: Payer: Self-pay

## 2020-07-21 ENCOUNTER — Ambulatory Visit: Payer: Medicaid Other | Admitting: Advanced Practice Midwife

## 2020-07-21 DIAGNOSIS — Z90710 Acquired absence of both cervix and uterus: Secondary | ICD-10-CM | POA: Insufficient documentation

## 2020-07-21 DIAGNOSIS — Z113 Encounter for screening for infections with a predominantly sexual mode of transmission: Secondary | ICD-10-CM | POA: Diagnosis not present

## 2020-07-21 DIAGNOSIS — Z62810 Personal history of physical and sexual abuse in childhood: Secondary | ICD-10-CM | POA: Insufficient documentation

## 2020-07-21 DIAGNOSIS — N760 Acute vaginitis: Secondary | ICD-10-CM | POA: Diagnosis not present

## 2020-07-21 DIAGNOSIS — Z9851 Tubal ligation status: Secondary | ICD-10-CM | POA: Insufficient documentation

## 2020-07-21 DIAGNOSIS — B9689 Other specified bacterial agents as the cause of diseases classified elsewhere: Secondary | ICD-10-CM

## 2020-07-21 DIAGNOSIS — F1290 Cannabis use, unspecified, uncomplicated: Secondary | ICD-10-CM

## 2020-07-21 LAB — WET PREP FOR TRICH, YEAST, CLUE
Trichomonas Exam: NEGATIVE
Yeast Exam: NEGATIVE

## 2020-07-21 MED ORDER — METRONIDAZOLE 500 MG PO TABS: 500.0000 mg | ORAL_TABLET | Freq: Two times a day (BID) | ORAL | 0 refills | Status: AC
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
## 2020-07-21 NOTE — Progress Notes (Signed)
Wet mount reviewed with provider and pt treated for BV per E. Sciora, CNM verbal order due to provider findings with Metronidazole 500mg  #14, 1 tab po BID x 7 days. Counseled pt per provider orders and pt states understanding. Provider orders completed.

## 2020-07-26 LAB — GONOCOCCUS CULTURE

## 2020-09-21 ENCOUNTER — Other Ambulatory Visit: Payer: Self-pay

## 2020-09-21 ENCOUNTER — Ambulatory Visit: Payer: Medicaid Other | Admitting: Family Medicine

## 2020-09-21 ENCOUNTER — Encounter: Payer: Self-pay | Admitting: Family Medicine

## 2020-09-21 DIAGNOSIS — Z113 Encounter for screening for infections with a predominantly sexual mode of transmission: Secondary | ICD-10-CM

## 2020-09-21 DIAGNOSIS — B9689 Other specified bacterial agents as the cause of diseases classified elsewhere: Secondary | ICD-10-CM

## 2020-09-21 DIAGNOSIS — N760 Acute vaginitis: Secondary | ICD-10-CM

## 2020-09-21 LAB — WET PREP FOR TRICH, YEAST, CLUE
Trichomonas Exam: NEGATIVE
Yeast Exam: NEGATIVE

## 2020-09-21 MED ORDER — METRONIDAZOLE 500 MG PO TABS: 500.0000 mg | ORAL_TABLET | Freq: Two times a day (BID) | ORAL | 0 refills | Status: AC
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
## 2020-09-21 NOTE — Progress Notes (Signed)
Wet mount reviewed with provider. Patient tx'd for BV per SO Aileen Fass, RN

## 2020-11-07 ENCOUNTER — Encounter: Payer: Self-pay | Admitting: Emergency Medicine

## 2020-11-07 ENCOUNTER — Emergency Department
Admission: EM | Admit: 2020-11-07 | Discharge: 2020-11-07 | Disposition: A | Discharge status: Home / Self Care | Payer: Medicaid Other | Attending: Emergency Medicine | Admitting: Emergency Medicine

## 2020-11-07 ENCOUNTER — Other Ambulatory Visit: Payer: Self-pay

## 2020-11-07 DIAGNOSIS — L03114 Cellulitis of left upper limb: Secondary | ICD-10-CM | POA: Diagnosis not present

## 2020-11-07 DIAGNOSIS — Z87891 Personal history of nicotine dependence: Secondary | ICD-10-CM | POA: Insufficient documentation

## 2020-11-07 MED ORDER — SULFAMETHOXAZOLE-TRIMETHOPRIM 800-160 MG PO TABS: 1.0000 | ORAL_TABLET | Freq: Two times a day (BID) | ORAL | 0 refills | Status: DC

## 2020-11-07 MED ORDER — SULFAMETHOXAZOLE-TRIMETHOPRIM 800-160 MG PO TABS
1.0000 | ORAL_TABLET | Freq: Once | ORAL | Status: AC
  Administered 2020-11-07: 1 via ORAL
  Filled 2020-11-07: qty 1

## 2020-11-07 NOTE — Discharge Instructions (Addendum)
Please take antibiotic as prescribed.  Apply ice to the dorsal aspect of the forearm 20 minutes every hour for the next 2 to 3 days.  If any increasing pain swelling warmth redness or drainage please return to the ER.

## 2020-11-07 NOTE — ED Triage Notes (Signed)
Pt to ED from home c/o left arm pain.  States had what she thought was mosquito bite on left arm and popped it and then noticed arm started swelling, with redness and tender.
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
## 2021-01-15 ENCOUNTER — Ambulatory Visit: Payer: Medicaid Other | Admitting: Physician Assistant

## 2021-01-15 DIAGNOSIS — Z113 Encounter for screening for infections with a predominantly sexual mode of transmission: Secondary | ICD-10-CM

## 2021-01-15 DIAGNOSIS — B9689 Other specified bacterial agents as the cause of diseases classified elsewhere: Secondary | ICD-10-CM

## 2021-01-15 DIAGNOSIS — N760 Acute vaginitis: Secondary | ICD-10-CM

## 2021-01-15 LAB — WET PREP FOR TRICH, YEAST, CLUE
Trichomonas Exam: NEGATIVE
Yeast Exam: NEGATIVE

## 2021-01-15 MED ORDER — METRONIDAZOLE 500 MG PO TABS: 500.0000 mg | ORAL_TABLET | Freq: Two times a day (BID) | ORAL | 0 refills | Status: AC

## 2021-01-15 NOTE — Progress Notes (Signed)
Pt here for STD screening.  Wet mount results reviewed with Provider.  Medication dispensed per Provider orders. Windle Guard, RN

## 2021-01-16 ENCOUNTER — Encounter: Payer: Self-pay | Admitting: Physician Assistant
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
## 2021-01-19 LAB — HM HIV SCREENING LAB: HM HIV Screening: NEGATIVE

## 2021-01-20 LAB — GONOCOCCUS CULTURE

## 2021-01-24 ENCOUNTER — Other Ambulatory Visit: Payer: Self-pay | Admitting: Neurology

## 2021-01-24 DIAGNOSIS — G935 Compression of brain: Secondary | ICD-10-CM

## 2021-01-26 ENCOUNTER — Emergency Department: Payer: Medicaid Other

## 2021-01-26 ENCOUNTER — Encounter: Payer: Self-pay | Admitting: Emergency Medicine

## 2021-01-26 ENCOUNTER — Other Ambulatory Visit: Payer: Self-pay

## 2021-01-26 ENCOUNTER — Emergency Department
Admission: EM | Admit: 2021-01-26 | Discharge: 2021-01-26 | Disposition: A | Discharge status: Home / Self Care | Payer: Medicaid Other | Attending: Emergency Medicine | Admitting: Emergency Medicine

## 2021-01-26 DIAGNOSIS — R1013 Epigastric pain: Secondary | ICD-10-CM | POA: Diagnosis not present

## 2021-01-26 DIAGNOSIS — R079 Chest pain, unspecified: Secondary | ICD-10-CM | POA: Insufficient documentation

## 2021-01-26 DIAGNOSIS — R070 Pain in throat: Secondary | ICD-10-CM | POA: Diagnosis not present

## 2021-01-26 DIAGNOSIS — Z87891 Personal history of nicotine dependence: Secondary | ICD-10-CM | POA: Diagnosis not present

## 2021-01-26 DIAGNOSIS — R1010 Upper abdominal pain, unspecified: Secondary | ICD-10-CM | POA: Diagnosis present

## 2021-01-26 LAB — URINALYSIS, COMPLETE (UACMP) WITH MICROSCOPIC
Bilirubin Urine: NEGATIVE
Glucose, UA: NEGATIVE mg/dL
Ketones, ur: NEGATIVE mg/dL
Nitrite: NEGATIVE
Protein, ur: NEGATIVE mg/dL
Specific Gravity, Urine: 1.025 (ref 1.005–1.030)
pH: 5 (ref 5.0–8.0)

## 2021-01-26 LAB — BASIC METABOLIC PANEL
Anion gap: 6 (ref 5–15)
BUN: 11 mg/dL (ref 6–20)
CO2: 27 mmol/L (ref 22–32)
Calcium: 8.7 mg/dL — ABNORMAL LOW (ref 8.9–10.3)
Chloride: 106 mmol/L (ref 98–111)
Creatinine, Ser: 0.91 mg/dL (ref 0.44–1.00)
GFR, Estimated: >60 mL/min (ref >60)
Glucose, Bld: 90 mg/dL (ref 70–99)
Potassium: 3.9 mmol/L (ref 3.5–5.1)
Sodium: 139 mmol/L (ref 135–145)

## 2021-01-26 LAB — CBC
HCT: 39.9 % (ref 36.0–46.0)
Hemoglobin: 13.3 g/dL (ref 12.0–15.0)
MCH: 32.4 pg (ref 26.0–34.0)
MCHC: 33.3 g/dL (ref 30.0–36.0)
MCV: 97.3 fL (ref 80.0–100.0)
Platelets: 284 10*3/uL (ref 150–400)
RBC: 4.1 MIL/uL (ref 3.87–5.11)
RDW: 12.5 % (ref 11.5–15.5)
WBC: 7.4 10*3/uL (ref 4.0–10.5)
nRBC: 0 % (ref 0.0–0.2)

## 2021-01-26 LAB — TSH: TSH: 3.535 u[IU]/mL (ref 0.350–4.500)

## 2021-01-26 LAB — TROPONIN I (HIGH SENSITIVITY): Troponin I (High Sensitivity): <2 ng/L (ref <18)

## 2021-01-26 MED ORDER — ALUM & MAG HYDROXIDE-SIMETH 200-200-20 MG/5ML PO SUSP
15.0000 mL | Freq: Once | ORAL | Status: AC
  Administered 2021-01-26: 15 mL via ORAL
  Filled 2021-01-26: qty 30

## 2021-01-26 MED ORDER — FAMOTIDINE 20 MG PO TABS
20.0000 mg | ORAL_TABLET | Freq: Once | ORAL | Status: AC
  Administered 2021-01-26: 20 mg via ORAL
  Filled 2021-01-26: qty 1

## 2021-01-26 NOTE — ED Triage Notes (Signed)
Pt comes into the ED via POV c/o abdominal pain (lower) and chest pain. Pt states the chest pain radiates into her neck and back.  Pt admits to [facility] but denies any nausea or dizziness.  Pt denies any cardiac history.  Pt admits the lower abdominal pain is generic but she has had a hysterectomy.  Pt in NAD with even and unlabored respirations and is ambulatory to triage.
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
## 2021-02-02 ENCOUNTER — Ambulatory Visit
Admission: RE | Admit: 2021-02-02 | Discharge: 2021-02-02 | Disposition: A | Discharge status: Home / Self Care | Payer: Medicaid Other | Source: Ambulatory Visit | Attending: Neurology | Admitting: Neurology

## 2021-02-02 ENCOUNTER — Other Ambulatory Visit: Payer: Self-pay

## 2021-02-02 DIAGNOSIS — G935 Compression of brain: Secondary | ICD-10-CM | POA: Insufficient documentation

## 2021-02-02 MED ORDER — GADOBUTROL 1 MMOL/ML IV SOLN
10.0000 mL | Freq: Once | INTRAVENOUS | Status: AC | PRN
  Administered 2021-02-02: 10 mL via INTRAVENOUS

## 2021-05-16 ENCOUNTER — Other Ambulatory Visit: Payer: Self-pay | Admitting: Neurology

## 2021-05-16 DIAGNOSIS — G935 Compression of brain: Secondary | ICD-10-CM

## 2021-05-22 ENCOUNTER — Ambulatory Visit: Admission: RE | Admit: 2021-05-22 | Payer: Medicaid Other | Source: Ambulatory Visit

## 2021-05-31 ENCOUNTER — Emergency Department: Payer: Medicaid Other

## 2021-05-31 ENCOUNTER — Other Ambulatory Visit: Payer: Self-pay

## 2021-05-31 ENCOUNTER — Encounter: Payer: Self-pay | Admitting: Emergency Medicine

## 2021-05-31 ENCOUNTER — Emergency Department
Admission: EM | Admit: 2021-05-31 | Discharge: 2021-05-31 | Disposition: A | Discharge status: Home / Self Care | Payer: Medicaid Other | Attending: Emergency Medicine | Admitting: Emergency Medicine

## 2021-05-31 DIAGNOSIS — Y92410 Unspecified street and highway as the place of occurrence of the external cause: Secondary | ICD-10-CM | POA: Diagnosis not present

## 2021-05-31 DIAGNOSIS — Z87891 Personal history of nicotine dependence: Secondary | ICD-10-CM | POA: Insufficient documentation

## 2021-05-31 DIAGNOSIS — R519 Headache, unspecified: Secondary | ICD-10-CM | POA: Insufficient documentation

## 2021-05-31 DIAGNOSIS — M791 Myalgia, unspecified site: Secondary | ICD-10-CM | POA: Insufficient documentation

## 2021-05-31 DIAGNOSIS — M542 Cervicalgia: Secondary | ICD-10-CM | POA: Diagnosis present

## 2021-05-31 MED ORDER — KETOROLAC TROMETHAMINE 30 MG/ML IJ SOLN
30.0000 mg | Freq: Once | INTRAMUSCULAR | Status: AC
  Administered 2021-05-31: 30 mg via INTRAMUSCULAR
  Filled 2021-05-31: qty 1

## 2021-05-31 MED ORDER — METHOCARBAMOL 500 MG PO TABS: 500.0000 mg | ORAL_TABLET | Freq: Three times a day (TID) | ORAL | 0 refills | Status: AC | PRN

## 2021-05-31 MED ORDER — MELOXICAM 15 MG PO TABS: 15.0000 mg | ORAL_TABLET | Freq: Every day | ORAL | 2 refills | Status: AC
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
## 2021-06-04 ENCOUNTER — Other Ambulatory Visit: Payer: Self-pay | Admitting: Neurology

## 2021-06-04 DIAGNOSIS — G935 Compression of brain: Secondary | ICD-10-CM

## 2021-06-15 ENCOUNTER — Ambulatory Visit
Admission: RE | Admit: 2021-06-15 | Discharge: 2021-06-15 | Disposition: A | Discharge status: Home / Self Care | Payer: Medicaid Other | Source: Ambulatory Visit | Attending: Neurology | Admitting: Neurology

## 2021-06-15 ENCOUNTER — Other Ambulatory Visit: Payer: Self-pay

## 2021-06-15 DIAGNOSIS — G935 Compression of brain: Secondary | ICD-10-CM

## 2021-06-22 ENCOUNTER — Ambulatory Visit: Payer: Medicaid Other | Admitting: Physician Assistant

## 2021-06-22 ENCOUNTER — Encounter: Payer: Self-pay | Admitting: Physician Assistant

## 2021-06-22 ENCOUNTER — Other Ambulatory Visit: Payer: Self-pay

## 2021-06-22 DIAGNOSIS — B9689 Other specified bacterial agents as the cause of diseases classified elsewhere: Secondary | ICD-10-CM

## 2021-06-22 DIAGNOSIS — N760 Acute vaginitis: Secondary | ICD-10-CM

## 2021-06-22 DIAGNOSIS — Z113 Encounter for screening for infections with a predominantly sexual mode of transmission: Secondary | ICD-10-CM

## 2021-06-22 LAB — WET PREP FOR TRICH, YEAST, CLUE
Trichomonas Exam: NEGATIVE
Yeast Exam: NEGATIVE

## 2021-06-22 MED ORDER — METRONIDAZOLE 500 MG PO TABS: 500.0000 mg | ORAL_TABLET | Freq: Two times a day (BID) | ORAL | 0 refills | Status: AC
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
## 2021-06-24 NOTE — Progress Notes (Signed)
Chart reviewed by Pharmacist  Suzanne Walker PharmD, Contract Pharmacist at [facility]  

## 2021-06-26 LAB — GONOCOCCUS CULTURE

## 2021-09-10 ENCOUNTER — Ambulatory Visit: Payer: Medicaid Other | Admitting: Nurse Practitioner

## 2021-09-10 ENCOUNTER — Other Ambulatory Visit: Payer: Self-pay

## 2021-09-10 ENCOUNTER — Telehealth: Payer: Self-pay | Admitting: Family Medicine

## 2021-09-10 ENCOUNTER — Encounter: Payer: Self-pay | Admitting: Nurse Practitioner

## 2021-09-10 DIAGNOSIS — Z113 Encounter for screening for infections with a predominantly sexual mode of transmission: Secondary | ICD-10-CM | POA: Diagnosis not present

## 2021-09-10 DIAGNOSIS — B379 Candidiasis, unspecified: Secondary | ICD-10-CM

## 2021-09-10 LAB — WET PREP FOR TRICH, YEAST, CLUE: Trichomonas Exam: NEGATIVE

## 2021-09-10 MED ORDER — FLUCONAZOLE 150 MG PO TABS: 150.0000 mg | ORAL_TABLET | Freq: Once | ORAL | 0 refills | Status: AC

## 2021-09-10 NOTE — Progress Notes (Signed)
Patient seen today for STD check. Wet mount reviewed. Orders completed. Condoms given.
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
## 2021-10-01 ENCOUNTER — Other Ambulatory Visit: Payer: Self-pay | Admitting: Neurology

## 2021-10-01 DIAGNOSIS — G43019 Migraine without aura, intractable, without status migrainosus: Secondary | ICD-10-CM

## 2021-12-12 ENCOUNTER — Ambulatory Visit: Payer: No Typology Code available for payment source

## 2021-12-12 ENCOUNTER — Ambulatory Visit
Admission: RE | Admit: 2021-12-12 | Discharge: 2021-12-12 | Disposition: A | Discharge status: Home / Self Care | Payer: Medicaid Other | Source: Ambulatory Visit | Attending: Neurology | Admitting: Neurology

## 2021-12-12 DIAGNOSIS — G43019 Migraine without aura, intractable, without status migrainosus: Secondary | ICD-10-CM

## 2021-12-12 DIAGNOSIS — G935 Compression of brain: Secondary | ICD-10-CM | POA: Diagnosis not present

## 2021-12-12 LAB — CSF CELL COUNT WITH DIFFERENTIAL
Eosinophils, CSF: 0 %
Lymphs, CSF: 92 %
Monocyte-Macrophage-Spinal Fluid: 8 %
RBC Count, CSF: 0 /mm3 (ref 0–3)
Segmented Neutrophils-CSF: 0 %
Tube #: 3
WBC, CSF: 8 /mm3 — ABNORMAL HIGH (ref 0–5)

## 2021-12-12 LAB — GLUCOSE, RANDOM: Glucose, Bld: 76 mg/dL (ref 70–99)

## 2021-12-12 LAB — PROTEIN, CSF: Total  Protein, CSF: 42 mg/dL (ref 15–45)

## 2021-12-12 LAB — GLUCOSE, CSF: Glucose, CSF: 51 mg/dL (ref 40–70)

## 2021-12-12 MED ORDER — LIDOCAINE HCL (PF) 1 % IJ SOLN
5.0000 mL | Freq: Once | INTRAMUSCULAR | Status: AC
  Administered 2021-12-12: 5 mL via INTRADERMAL
  Filled 2021-12-12: qty 5

## 2021-12-13 LAB — IGG CSF INDEX
Albumin CSF-mCnc: 24 mg/dL (ref 8–37)
Albumin: 4 g/dL (ref 3.8–4.8)
CSF IgG Index: 0.5 (ref 0.0–0.7)
IgG (Immunoglobin G), Serum: 1215 mg/dL (ref 586–1602)
IgG, CSF: 3.7 mg/dL (ref 0.0–6.7)
IgG/Alb Ratio, CSF: 0.15 (ref 0.00–0.25)

## 2021-12-14 LAB — OLIGOCLONAL BANDS, CSF + SERM

## 2021-12-18 LAB — ANGIOTENSIN CONVERTING ENZYME, CSF: Angio Convert Enzyme: <1.5 U/L (ref 0.0–2.5)

## 2022-03-07 ENCOUNTER — Other Ambulatory Visit: Payer: Self-pay | Admitting: Student

## 2022-03-07 DIAGNOSIS — M5416 Radiculopathy, lumbar region: Secondary | ICD-10-CM

## 2022-03-20 ENCOUNTER — Encounter: Payer: Self-pay | Admitting: Family Medicine

## 2022-03-20 ENCOUNTER — Ambulatory Visit: Payer: Medicaid Other | Admitting: Family Medicine

## 2022-03-20 DIAGNOSIS — Z113 Encounter for screening for infections with a predominantly sexual mode of transmission: Secondary | ICD-10-CM | POA: Diagnosis not present

## 2022-03-20 DIAGNOSIS — B9689 Other specified bacterial agents as the cause of diseases classified elsewhere: Secondary | ICD-10-CM

## 2022-03-20 LAB — HM HEPATITIS C SCREENING LAB: HM Hepatitis Screen: NEGATIVE

## 2022-03-20 LAB — HM HIV SCREENING LAB: HM HIV Screening: NEGATIVE

## 2022-03-20 LAB — WET PREP FOR TRICH, YEAST, CLUE
Trichomonas Exam: NEGATIVE
Yeast Exam: NEGATIVE

## 2022-03-20 LAB — HEPATITIS B SURFACE ANTIGEN

## 2022-03-20 MED ORDER — METRONIDAZOLE 500 MG PO TABS: 500.0000 mg | ORAL_TABLET | Freq: Three times a day (TID) | ORAL | 0 refills | Status: AC
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
## 2022-03-20 NOTE — Progress Notes (Signed)
Pt here for STD screening.  Wet mount results reviewed.  The patient was dispensed Metronidazole 500 mg #14  today. I provided counseling today regarding the medication. We discussed the medication, the side effects and when to call clinic. Patient given the opportunity to ask questions. Questions answered. Condoms given.  Windle Guard, RN

## 2022-03-21 ENCOUNTER — Ambulatory Visit: Admission: RE | Admit: 2022-03-21 | Payer: Medicaid Other | Source: Ambulatory Visit

## 2022-03-24 LAB — GONOCOCCUS CULTURE

## 2022-04-30 ENCOUNTER — Ambulatory Visit
Admission: RE | Admit: 2022-04-30 | Discharge: 2022-04-30 | Disposition: A | Discharge status: Home / Self Care | Payer: Medicaid Other | Source: Ambulatory Visit | Attending: Student | Admitting: Student

## 2022-04-30 DIAGNOSIS — M5416 Radiculopathy, lumbar region: Secondary | ICD-10-CM

## 2022-06-04 ENCOUNTER — Ambulatory Visit: Payer: Medicaid Other

## 2022-07-09 ENCOUNTER — Ambulatory Visit: Payer: Medicaid Other | Attending: Neurology

## 2022-07-09 ENCOUNTER — Ambulatory Visit: Payer: Medicaid Other

## 2022-07-09 DIAGNOSIS — M546 Pain in thoracic spine: Secondary | ICD-10-CM | POA: Insufficient documentation

## 2022-07-09 DIAGNOSIS — R262 Difficulty in walking, not elsewhere classified: Secondary | ICD-10-CM | POA: Insufficient documentation

## 2022-07-09 DIAGNOSIS — R208 Other disturbances of skin sensation: Secondary | ICD-10-CM | POA: Diagnosis present
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
## 2022-07-11 ENCOUNTER — Ambulatory Visit: Payer: Medicaid Other

## 2022-07-15 ENCOUNTER — Telehealth: Payer: Self-pay

## 2022-07-15 ENCOUNTER — Ambulatory Visit: Payer: Medicaid Other | Admitting: Physical Therapy
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
## 2022-07-15 NOTE — Telephone Encounter (Signed)
Pt called to inform clinic that due to a death in the family, she needed to cancel all remaining PT appointments. Call taken by support rep Lelon Huh who confirmed indication to cancel ALL future appointment.   8:32 AM, 07/15/22 Kelli Richard, PT, DPT Physical Therapist - [place] [phone]

## 2022-07-18 ENCOUNTER — Ambulatory Visit: Payer: Medicaid Other | Admitting: Physical Therapy

## 2022-07-18 ENCOUNTER — Encounter: Payer: Medicaid Other | Admitting: Occupational Therapy

## 2022-07-19 ENCOUNTER — Ambulatory Visit: Payer: Medicaid Other

## 2022-07-22 ENCOUNTER — Encounter: Payer: Self-pay | Admitting: Nurse Practitioner

## 2022-07-22 ENCOUNTER — Ambulatory Visit: Payer: Medicaid Other | Admitting: Nurse Practitioner

## 2022-07-22 ENCOUNTER — Ambulatory Visit: Payer: Medicaid Other

## 2022-07-22 DIAGNOSIS — B9689 Other specified bacterial agents as the cause of diseases classified elsewhere: Secondary | ICD-10-CM

## 2022-07-22 DIAGNOSIS — Z113 Encounter for screening for infections with a predominantly sexual mode of transmission: Secondary | ICD-10-CM

## 2022-07-22 DIAGNOSIS — N760 Acute vaginitis: Secondary | ICD-10-CM

## 2022-07-22 DIAGNOSIS — Z8639 Personal history of other endocrine, nutritional and metabolic disease: Secondary | ICD-10-CM | POA: Insufficient documentation

## 2022-07-22 LAB — WET PREP FOR TRICH, YEAST, CLUE
Trichomonas Exam: NEGATIVE
Yeast Exam: NEGATIVE

## 2022-07-22 LAB — HM HEPATITIS C SCREENING LAB: HM Hepatitis Screen: NEGATIVE

## 2022-07-22 LAB — HM HIV SCREENING LAB: HM HIV Screening: NEGATIVE

## 2022-07-22 MED ORDER — METRONIDAZOLE 500 MG PO TABS: 500.0000 mg | ORAL_TABLET | Freq: Two times a day (BID) | ORAL | 0 refills | Status: DC

## 2022-07-22 NOTE — Progress Notes (Signed)
Pt appointment for STI screening. Seen by FNP White. Initial lab results reviewed with pt. Treatment for BV dispensed with instructions.
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
## 2022-07-24 ENCOUNTER — Ambulatory Visit: Payer: Medicaid Other

## 2022-07-29 ENCOUNTER — Ambulatory Visit: Payer: Medicaid Other

## 2022-07-31 ENCOUNTER — Ambulatory Visit: Payer: Medicaid Other | Admitting: Physical Therapy

## 2022-08-05 ENCOUNTER — Ambulatory Visit: Payer: Medicaid Other

## 2022-08-07 ENCOUNTER — Ambulatory Visit: Payer: Medicaid Other

## 2022-08-13 ENCOUNTER — Ambulatory Visit: Payer: Medicaid Other | Admitting: Physical Therapy

## 2022-08-15 ENCOUNTER — Ambulatory Visit: Payer: Medicaid Other | Admitting: Physical Therapy

## 2022-08-21 ENCOUNTER — Ambulatory Visit: Payer: Medicaid Other

## 2022-08-23 ENCOUNTER — Ambulatory Visit: Payer: Medicaid Other

## 2022-08-28 ENCOUNTER — Ambulatory Visit: Payer: Medicaid Other

## 2022-09-06 ENCOUNTER — Ambulatory Visit: Payer: Medicaid Other

## 2022-09-10 ENCOUNTER — Ambulatory Visit: Payer: Medicaid Other | Admitting: Physical Therapy

## 2022-09-12 ENCOUNTER — Ambulatory Visit: Payer: Medicaid Other

## 2022-09-17 ENCOUNTER — Ambulatory Visit: Payer: Medicaid Other

## 2022-09-19 ENCOUNTER — Ambulatory Visit: Payer: Medicaid Other

## 2022-09-24 ENCOUNTER — Ambulatory Visit: Payer: Medicaid Other

## 2022-09-26 ENCOUNTER — Ambulatory Visit: Payer: Medicaid Other

## 2022-10-01 ENCOUNTER — Ambulatory Visit: Payer: Medicaid Other

## 2022-10-03 ENCOUNTER — Ambulatory Visit: Payer: Medicaid Other

## 2022-10-08 ENCOUNTER — Ambulatory Visit: Payer: Medicaid Other

## 2022-10-10 ENCOUNTER — Ambulatory Visit: Payer: Medicaid Other

## 2022-10-15 ENCOUNTER — Ambulatory Visit: Payer: Medicaid Other

## 2022-10-17 ENCOUNTER — Ambulatory Visit: Payer: Medicaid Other

## 2022-10-22 ENCOUNTER — Ambulatory Visit: Payer: Medicaid Other

## 2022-10-24 ENCOUNTER — Ambulatory Visit: Payer: Medicaid Other

## 2022-10-24 ENCOUNTER — Other Ambulatory Visit: Payer: Self-pay | Admitting: Student

## 2022-10-24 DIAGNOSIS — G935 Compression of brain: Secondary | ICD-10-CM

## 2022-10-29 ENCOUNTER — Ambulatory Visit: Payer: Medicaid Other

## 2022-10-31 ENCOUNTER — Ambulatory Visit: Payer: Medicaid Other

## 2022-11-01 ENCOUNTER — Ambulatory Visit
Admission: RE | Admit: 2022-11-01 | Discharge: 2022-11-01 | Disposition: A | Discharge status: Home / Self Care | Payer: Medicaid Other | Source: Ambulatory Visit | Attending: Student | Admitting: Student

## 2022-11-01 DIAGNOSIS — G935 Compression of brain: Secondary | ICD-10-CM | POA: Diagnosis present

## 2022-11-05 ENCOUNTER — Ambulatory Visit: Payer: Medicaid Other

## 2022-11-07 ENCOUNTER — Ambulatory Visit: Payer: Medicaid Other

## 2022-11-14 ENCOUNTER — Ambulatory Visit: Payer: Medicaid Other

## 2022-11-19 ENCOUNTER — Ambulatory Visit: Payer: Medicaid Other

## 2022-11-21 ENCOUNTER — Ambulatory Visit: Payer: Medicaid Other

## 2022-12-17 ENCOUNTER — Other Ambulatory Visit: Payer: Self-pay | Admitting: Family Medicine

## 2022-12-17 DIAGNOSIS — Z1231 Encounter for screening mammogram for malignant neoplasm of breast: Secondary | ICD-10-CM

## 2023-01-13 ENCOUNTER — Encounter: Payer: Self-pay | Admitting: Family Medicine

## 2023-01-13 ENCOUNTER — Ambulatory Visit: Payer: Medicaid Other | Admitting: Family Medicine

## 2023-01-13 DIAGNOSIS — Z113 Encounter for screening for infections with a predominantly sexual mode of transmission: Secondary | ICD-10-CM | POA: Diagnosis not present

## 2023-01-13 LAB — HM HIV SCREENING LAB: HM HIV Screening: NEGATIVE

## 2023-01-13 LAB — WET PREP FOR TRICH, YEAST, CLUE
Trichomonas Exam: NEGATIVE
Yeast Exam: NEGATIVE

## 2023-01-13 NOTE — Progress Notes (Signed)
Wet prep reviewed - negative results. Juliauna Stueve, RN  
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
## 2023-01-18 LAB — GONOCOCCUS CULTURE

## 2023-06-10 ENCOUNTER — Emergency Department: Payer: Medicaid Other

## 2023-06-10 ENCOUNTER — Emergency Department
Admission: EM | Admit: 2023-06-10 | Discharge: 2023-06-10 | Disposition: A | Discharge status: Home / Self Care | Payer: Medicaid Other | Attending: Emergency Medicine | Admitting: Emergency Medicine

## 2023-06-10 ENCOUNTER — Other Ambulatory Visit: Payer: Self-pay

## 2023-06-10 DIAGNOSIS — R1012 Left upper quadrant pain: Secondary | ICD-10-CM | POA: Diagnosis not present

## 2023-06-10 DIAGNOSIS — W19XXXA Unspecified fall, initial encounter: Secondary | ICD-10-CM

## 2023-06-10 DIAGNOSIS — W228XXA Striking against or struck by other objects, initial encounter: Secondary | ICD-10-CM | POA: Insufficient documentation

## 2023-06-10 DIAGNOSIS — R109 Unspecified abdominal pain: Secondary | ICD-10-CM | POA: Diagnosis present

## 2023-06-10 LAB — COMPREHENSIVE METABOLIC PANEL
ALT: 11 U/L (ref 0–44)
AST: 16 U/L (ref 15–41)
Albumin: 4 g/dL (ref 3.5–5.0)
Alkaline Phosphatase: 72 U/L (ref 38–126)
Anion gap: 11 (ref 5–15)
BUN: 8 mg/dL (ref 6–20)
CO2: 23 mmol/L (ref 22–32)
Calcium: 8.9 mg/dL (ref 8.9–10.3)
Chloride: 104 mmol/L (ref 98–111)
Creatinine, Ser: 0.84 mg/dL (ref 0.44–1.00)
GFR, Estimated: >60 mL/min (ref >60)
Glucose, Bld: 87 mg/dL (ref 70–99)
Potassium: 3.9 mmol/L (ref 3.5–5.1)
Sodium: 138 mmol/L (ref 135–145)
Total Bilirubin: 0.5 mg/dL (ref 0.3–1.2)
Total Protein: 7.4 g/dL (ref 6.5–8.1)

## 2023-06-10 LAB — URINALYSIS, ROUTINE W REFLEX MICROSCOPIC
Bilirubin Urine: NEGATIVE
Glucose, UA: NEGATIVE mg/dL
Ketones, ur: NEGATIVE mg/dL
Leukocytes,Ua: NEGATIVE
Nitrite: NEGATIVE
Protein, ur: NEGATIVE mg/dL
Specific Gravity, Urine: 1.01 (ref 1.005–1.030)
pH: 7 (ref 5.0–8.0)

## 2023-06-10 LAB — CBC
HCT: 40.5 % (ref 36.0–46.0)
Hemoglobin: 13.4 g/dL (ref 12.0–15.0)
MCH: 32.1 pg (ref 26.0–34.0)
MCHC: 33.1 g/dL (ref 30.0–36.0)
MCV: 97.1 fL (ref 80.0–100.0)
Platelets: 275 10*3/uL (ref 150–400)
RBC: 4.17 MIL/uL (ref 3.87–5.11)
RDW: 12.5 % (ref 11.5–15.5)
WBC: 8 10*3/uL (ref 4.0–10.5)
nRBC: 0 % (ref 0.0–0.2)

## 2023-06-10 LAB — LIPASE, BLOOD: Lipase: 29 U/L (ref 11–51)

## 2023-06-10 MED ORDER — KETOROLAC TROMETHAMINE 30 MG/ML IJ SOLN
15.0000 mg | Freq: Once | INTRAMUSCULAR | Status: AC
  Administered 2023-06-10: 15 mg via INTRAVENOUS
  Filled 2023-06-10: qty 1

## 2023-06-10 MED ORDER — ONDANSETRON HCL 4 MG/2ML IJ SOLN
4.0000 mg | Freq: Once | INTRAMUSCULAR | Status: AC
  Administered 2023-06-10: 4 mg via INTRAVENOUS
  Filled 2023-06-10: qty 2

## 2023-06-10 MED ORDER — IOHEXOL 300 MG/ML  SOLN
100.0000 mL | Freq: Once | INTRAMUSCULAR | Status: AC | PRN
  Administered 2023-06-10: 100 mL via INTRAVENOUS

## 2023-06-10 NOTE — ED Provider Notes (Signed)
[facility] Provider Note    Event Date/Time   First MD Initiated Contact with Patient 06/10/23 1810     (approximate)   History   Chief Complaint Abdominal Pain and Back Pain   HPI  Kelli Richard is a 42 y.o. female with past medical history of Chiari malformation, occipital neuralgia, migraine headaches, and fibromyalgia who presents to the ED complaining of abdominal pain.  Patient reports that she had a fall 3 days ago, striking the left side of her abdomen on a piece of furniture.  Since then, she has noticed increased bruising and pain over her left abdomen as well as into the left side of her back.  She reports feeling nauseous but has not vomited, denies any diarrhea but does endorse some dysuria.  She underwent lumbar puncture yesterday at [facility] for possible idiopathic intracranial hypertension.  She does report some ongoing headache, denies any fevers or neck stiffness and has not had any numbness or weakness.     Physical Exam   Triage Vital Signs: ED Triage Vitals  Encounter Vitals Group     BP 06/10/23 1741 119/66     Systolic BP Percentile --      Diastolic BP Percentile --      Pulse Rate 06/10/23 1741 (!) 103     Resp 06/10/23 1741 20     Temp 06/10/23 1741 98.6 F (37 C)     Temp src --      SpO2 06/10/23 1741 100 %     Weight 06/10/23 1743 230 lb (104.3 kg)     Height 06/10/23 1743 5\' 7"  (1.702 m)     Head Circumference --      Peak Flow --      Pain Score 06/10/23 1743 10     Pain Loc --      Pain Education --      Exclude from Growth Chart --     Most recent vital signs: Vitals:   06/10/23 1741 06/10/23 1917  BP: 119/66 114/69  Pulse: (!) 103 83  Resp: 20 18  Temp: 98.6 F (37 C)   SpO2: 100% 100%    Constitutional: Alert and oriented. Eyes: Conjunctivae are normal. Head: Atraumatic. Nose: No congestion/rhinnorhea. Mouth/Throat: Mucous membranes are moist.  Neck: Supple with no  meningismus. Cardiovascular: Normal rate, regular rhythm. Grossly normal heart sounds.  2+ radial pulses bilaterally. Respiratory: Normal respiratory effort.  No retractions. Lungs CTAB. Gastrointestinal: Soft and tender to palpation in the left upper and lower quadrants with overlying ecchymosis. No distention. Musculoskeletal: No lower extremity tenderness nor edema.  Neurologic:  Normal speech and language. No gross focal neurologic deficits are appreciated.    ED Results / Procedures / Treatments   Labs (all labs ordered are listed, but only abnormal results are displayed) Labs Reviewed  URINALYSIS, ROUTINE W REFLEX MICROSCOPIC - Abnormal; Notable for the following components:      Result Value   Color, Urine YELLOW (*)    APPearance CLEAR (*)    Hgb urine dipstick SMALL (*)    Bacteria, UA RARE (*)    All other components within normal limits  LIPASE, BLOOD  COMPREHENSIVE METABOLIC PANEL  CBC   RADIOLOGY CT abdomen/pelvis reviewed and interpreted by me with no infiltrate, edema, or effusion.  PROCEDURES:  Critical Care performed: No  Procedures   MEDICATIONS ORDERED IN ED: Medications  ketorolac (TORADOL) 30 MG/ML injection 15 mg (15 mg Intravenous Given 06/10/23 1919)  ondansetron (  ZOFRAN) injection 4 mg (4 mg Intravenous Given 06/10/23 1918)  iohexol (OMNIPAQUE) 300 MG/ML solution 100 mL (100 mLs Intravenous Contrast Given 06/10/23 1926)     IMPRESSION / MDM / ASSESSMENT AND PLAN / ED COURSE  I reviewed the triage vital signs and the nursing notes.                              42 y.o. female with past medical history of Chiari malformation, occipital neuralgia, migraines, and fibromyalgia who presents to the ED with increasing pain and bruising over the left side of her abdomen over the past 2 days following a fall.  Patient's presentation is most consistent with acute presentation with potential threat to life or bodily function.  Differential diagnosis  includes, but is not limited to, splenic injury, abdominal wall contusion, intestinal injury, hematoma.  Patient nontoxic-appearing and in no acute distress, vital signs are unremarkable.  She primarily complains of pain over the left side of her abdomen where she has some tenderness and ecchymosis.  We will further assess with CT imaging for traumatic injury, treat symptomatically with IV Toradol and Zofran.  Labs are reassuring with no significant anemia, leukocytosis, electrolyte abnormality, or AKI.  She is status post hysterectomy and urinalysis pending at this time.  CT imaging is negative for acute process, urinalysis also unremarkable.  Patient reports feeling better on reassessment with pain improved following IV Toradol.  She is appropriate for discharge home with outpatient follow-up, was counseled to return to the ED for new or worsening symptoms.  Patient agrees with plan.      FINAL CLINICAL IMPRESSION(S) / ED DIAGNOSES   Final diagnoses:  Left upper quadrant abdominal pain  Fall, initial encounter     Rx / DC Orders   ED Discharge Orders     None        Note:  This document was prepared using Dragon voice recognition software and may include unintentional dictation errors.   Chesley Noon, MD 06/10/23 2104

## 2023-06-10 NOTE — ED Triage Notes (Signed)
Pt sts that she has been having back pain and head pain for the last day. Pt sts that she had a spinal tap done at [facility] 8 days ago and since than she has not been feeling well. Pt sts that she has a chiari mal formation and had the spinal tap due to that.

## 2023-07-09 ENCOUNTER — Ambulatory Visit: Payer: Medicaid Other | Admitting: Family Medicine

## 2023-07-09 ENCOUNTER — Encounter: Payer: Self-pay | Admitting: Family Medicine

## 2023-07-09 DIAGNOSIS — Z113 Encounter for screening for infections with a predominantly sexual mode of transmission: Secondary | ICD-10-CM

## 2023-07-09 DIAGNOSIS — B9689 Other specified bacterial agents as the cause of diseases classified elsewhere: Secondary | ICD-10-CM

## 2023-07-09 LAB — WET PREP FOR TRICH, YEAST, CLUE
Trichomonas Exam: NEGATIVE
Yeast Exam: NEGATIVE

## 2023-07-09 LAB — HM HIV SCREENING LAB: HM HIV Screening: NEGATIVE

## 2023-07-09 MED ORDER — METRONIDAZOLE 500 MG PO TABS: 500.0000 mg | ORAL_TABLET | Freq: Two times a day (BID) | ORAL | Status: AC
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
## 2023-07-09 NOTE — Progress Notes (Signed)
Wet prep reviewed and treated for BV per Aliene Altes FNP order. Counseled on how to take medication and client verbalized understanding. Jossie Ng, RN

## 2023-07-09 NOTE — Addendum Note (Signed)
Addended by: Lenice Llamas on: 07/09/2023 11:22 AM   Modules accepted: Orders

## 2023-08-26 ENCOUNTER — Emergency Department
Admission: EM | Admit: 2023-08-26 | Discharge: 2023-08-26 | Disposition: A | Discharge status: Home / Self Care | Payer: Medicaid Other | Attending: Emergency Medicine | Admitting: Emergency Medicine

## 2023-08-26 ENCOUNTER — Other Ambulatory Visit: Payer: Self-pay

## 2023-08-26 ENCOUNTER — Emergency Department: Payer: Medicaid Other

## 2023-08-26 DIAGNOSIS — Z20822 Contact with and (suspected) exposure to covid-19: Secondary | ICD-10-CM | POA: Diagnosis not present

## 2023-08-26 DIAGNOSIS — J069 Acute upper respiratory infection, unspecified: Secondary | ICD-10-CM | POA: Insufficient documentation

## 2023-08-26 DIAGNOSIS — G43809 Other migraine, not intractable, without status migrainosus: Secondary | ICD-10-CM

## 2023-08-26 DIAGNOSIS — R519 Headache, unspecified: Secondary | ICD-10-CM | POA: Diagnosis present

## 2023-08-26 DIAGNOSIS — G43909 Migraine, unspecified, not intractable, without status migrainosus: Secondary | ICD-10-CM | POA: Diagnosis not present

## 2023-08-26 LAB — BASIC METABOLIC PANEL
Anion gap: 8 (ref 5–15)
BUN: 9 mg/dL (ref 6–20)
CO2: 25 mmol/L (ref 22–32)
Calcium: 8.6 mg/dL — ABNORMAL LOW (ref 8.9–10.3)
Chloride: 101 mmol/L (ref 98–111)
Creatinine, Ser: 0.82 mg/dL (ref 0.44–1.00)
GFR, Estimated: >60 mL/min (ref >60)
Glucose, Bld: 90 mg/dL (ref 70–99)
Potassium: 4.1 mmol/L (ref 3.5–5.1)
Sodium: 134 mmol/L — ABNORMAL LOW (ref 135–145)

## 2023-08-26 LAB — CBC
HCT: 41.6 % (ref 36.0–46.0)
Hemoglobin: 13.7 g/dL (ref 12.0–15.0)
MCH: 32.3 pg (ref 26.0–34.0)
MCHC: 32.9 g/dL (ref 30.0–36.0)
MCV: 98.1 fL (ref 80.0–100.0)
Platelets: 238 10*3/uL (ref 150–400)
RBC: 4.24 MIL/uL (ref 3.87–5.11)
RDW: 12.4 % (ref 11.5–15.5)
WBC: 11.2 10*3/uL — ABNORMAL HIGH (ref 4.0–10.5)
nRBC: 0 % (ref 0.0–0.2)

## 2023-08-26 LAB — RESP PANEL BY RT-PCR (RSV, FLU A&B, COVID)  RVPGX2
Influenza A by PCR: NEGATIVE
Influenza B by PCR: NEGATIVE
Resp Syncytial Virus by PCR: NEGATIVE
SARS Coronavirus 2 by RT PCR: NEGATIVE

## 2023-08-26 LAB — TROPONIN I (HIGH SENSITIVITY): Troponin I (High Sensitivity): 2 ng/L (ref <18)

## 2023-08-26 MED ORDER — KETOROLAC TROMETHAMINE 30 MG/ML IJ SOLN
15.0000 mg | Freq: Once | INTRAMUSCULAR | Status: AC
  Administered 2023-08-26: 15 mg via INTRAVENOUS
  Filled 2023-08-26: qty 1

## 2023-08-26 MED ORDER — SODIUM CHLORIDE 0.9 % IV BOLUS
1000.0000 mL | Freq: Once | INTRAVENOUS | Status: AC
  Administered 2023-08-26: 1000 mL via INTRAVENOUS

## 2023-08-26 MED ORDER — METOCLOPRAMIDE HCL 5 MG/ML IJ SOLN
10.0000 mg | Freq: Once | INTRAMUSCULAR | Status: AC
  Administered 2023-08-26: 10 mg via INTRAVENOUS
  Filled 2023-08-26: qty 2

## 2023-08-26 MED ORDER — DIPHENHYDRAMINE HCL 50 MG/ML IJ SOLN
50.0000 mg | Freq: Once | INTRAMUSCULAR | Status: AC
  Administered 2023-08-26: 50 mg via INTRAVENOUS
  Filled 2023-08-26: qty 1
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
## 2023-08-26 NOTE — ED Provider Notes (Signed)
[facility] Provider Note    Event Date/Time   First MD Initiated Contact with Patient 08/26/23 [phone]     (approximate)  History   Chief Complaint: Fever and Headache  HPI  Kelli Richard is a 42 y.o. female with a past medical history of anemia, fibromyalgia, migraine headaches, Chiari malformation, presents to the emergency department for a headache.  According to the patient since yesterday she has had a cough and nasal congestion.  She is also had a headache since yesterday described as a migraine which is fairly typical for her per patient.  Patient states low-grade temperature yesterday of 100.0.  Has not taken Tylenol or ibuprofen today but did take Tylenol last night.  Patient is afebrile in the emergency department.  I rechecked it in the room remains afebrile 97.8 on last check.  Physical Exam   Triage Vital Signs: ED Triage Vitals  Encounter Vitals Group     BP 08/26/23 0921 135/79     Systolic BP Percentile --      Diastolic BP Percentile --      Pulse Rate 08/26/23 0918 (!) 109     Resp 08/26/23 0918 18     Temp 08/26/23 0918 98.5 F (36.9 C)     Temp Source 08/26/23 0918 Oral     SpO2 08/26/23 0918 99 %     Weight 08/26/23 0919 229 lb 15 oz (104.3 kg)     Height 08/26/23 0919 5\' 7"  (1.702 m)     Head Circumference --      Peak Flow --      Pain Score 08/26/23 0919 10     Pain Loc --      Pain Education --      Exclude from Growth Chart --     Most recent vital signs: Vitals:   08/26/23 0918 08/26/23 0921  BP:  135/79  Pulse: (!) 109   Resp: 18   Temp: 98.5 F (36.9 C)   SpO2: 99%     General: Awake, no distress.  CV:  Good peripheral perfusion.  Regular rate and rhythm  Resp:  Normal effort.  Equal breath sounds bilaterally.  Abd:  No distention.  Soft, nontender.  No rebound or guarding.  ED Results / Procedures / Treatments   EKG  EKG viewed and interpreted by myself shows a sinus rhythm at 96 bpm with a narrow  QRS, normal axis, normal intervals, no concerning ST changes.  Occasional PVC.  I have reviewed interpret the chest x-ray images.  No consolidation on my evaluation. Radiology is read the x-ray is negative for acute abnormality.  MEDICATIONS ORDERED IN ED: Medications  ketorolac (TORADOL) 30 MG/ML injection 15 mg (has no administration in time range)  metoCLOPramide (REGLAN) injection 10 mg (has no administration in time range)  diphenhydrAMINE (BENADRYL) injection 50 mg (has no administration in time range)  sodium chloride 0.9 % bolus 1,000 mL (has no administration in time range)     IMPRESSION / MDM / ASSESSMENT AND PLAN / ED COURSE  I reviewed the triage vital signs and the nursing notes.  Patient's presentation is most consistent with acute presentation with potential threat to life or bodily function.  Patient presents the emergency department for a headache.  History of frequent migraines per patient.  States since yesterday she has had a migraine headache.  Took Tylenol yesterday but did not take anything today.  Is afebrile although patient states low-grade temperature yesterday along with  cough and congestion since yesterday as well.  We obtain a COVID/flu/RSV swab.  Will check basic labs.  Will treat the patient's migraine headache with Toradol, Reglan, Benadryl and IV fluids and continue to closely monitor.  Patient agreeable to plan of care.  Patient is feeling much better after migraine medications.  Patient's COVID/flu/RSV is negative.  Lab work shows a reassuring CBC reassuring chemistry negative troponin.  FINAL CLINICAL IMPRESSION(S) / ED DIAGNOSES   Migraine headache Upper respiratory infection   Note:  This document was prepared using Dragon voice recognition software and may include unintentional dictation errors.   Minna Antis, MD 08/26/23 1454

## 2023-10-03 ENCOUNTER — Ambulatory Visit: Payer: Medicaid Other | Admitting: Advanced Practice Midwife

## 2023-10-03 ENCOUNTER — Encounter: Payer: Self-pay | Admitting: Advanced Practice Midwife

## 2023-10-03 DIAGNOSIS — Z113 Encounter for screening for infections with a predominantly sexual mode of transmission: Secondary | ICD-10-CM

## 2023-10-03 DIAGNOSIS — N760 Acute vaginitis: Secondary | ICD-10-CM

## 2023-10-03 DIAGNOSIS — A599 Trichomoniasis, unspecified: Secondary | ICD-10-CM | POA: Insufficient documentation

## 2023-10-03 LAB — HM HEPATITIS C SCREENING LAB: HM Hepatitis Screen: NEGATIVE

## 2023-10-03 LAB — WET PREP FOR TRICH, YEAST, CLUE
Trichomonas Exam: POSITIVE — AB
Yeast Exam: NEGATIVE

## 2023-10-03 LAB — HM HIV SCREENING LAB: HM HIV Screening: NEGATIVE

## 2023-10-03 LAB — HEPATITIS B SURFACE ANTIGEN

## 2023-10-03 MED ORDER — METRONIDAZOLE 500 MG PO TABS: 500.0000 mg | ORAL_TABLET | Freq: Two times a day (BID) | ORAL | Status: AC

## 2023-10-03 NOTE — Progress Notes (Signed)
Pt is here for STD screening. The patient was dispensed metronidazole 500 mg 2x/day for 7 days today. I provided counseling today regarding the medication. We discussed the medication, the side effects and when to call clinic. Patient given the opportunity to ask questions for any clarification. Condoms declined, brochure and contact card given. Sonda Primes, RN
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
## 2023-10-06 ENCOUNTER — Telehealth: Payer: Self-pay

## 2023-11-12 ENCOUNTER — Emergency Department
Admission: EM | Admit: 2023-11-12 | Discharge: 2023-11-12 | Disposition: A | Discharge status: Home / Self Care | Attending: Emergency Medicine | Admitting: Emergency Medicine

## 2023-11-12 ENCOUNTER — Encounter: Payer: Self-pay | Admitting: Emergency Medicine

## 2023-11-12 ENCOUNTER — Other Ambulatory Visit: Payer: Self-pay

## 2023-11-12 DIAGNOSIS — J101 Influenza due to other identified influenza virus with other respiratory manifestations: Secondary | ICD-10-CM | POA: Diagnosis not present

## 2023-11-12 DIAGNOSIS — R509 Fever, unspecified: Secondary | ICD-10-CM | POA: Diagnosis present

## 2023-11-12 DIAGNOSIS — J111 Influenza due to unidentified influenza virus with other respiratory manifestations: Secondary | ICD-10-CM

## 2023-11-12 LAB — GROUP A STREP BY PCR: Group A Strep by PCR: NOT DETECTED

## 2023-11-12 LAB — RESP PANEL BY RT-PCR (RSV, FLU A&B, COVID)  RVPGX2
Influenza A by PCR: POSITIVE — AB
Influenza B by PCR: NEGATIVE
Resp Syncytial Virus by PCR: NEGATIVE
SARS Coronavirus 2 by RT PCR: NEGATIVE

## 2023-11-12 MED ORDER — OSELTAMIVIR PHOSPHATE 75 MG PO CAPS: 75.0000 mg | ORAL_CAPSULE | Freq: Two times a day (BID) | ORAL | 0 refills | Status: AC

## 2023-11-12 MED ORDER — KETOROLAC TROMETHAMINE 30 MG/ML IJ SOLN
30.0000 mg | Freq: Once | INTRAMUSCULAR | Status: AC
Start: 2023-11-12 — End: 2023-11-12
  Administered 2023-11-12: 30 mg via INTRAMUSCULAR
  Filled 2023-11-12: qty 1

## 2023-11-12 MED ORDER — ONDANSETRON 4 MG PO TBDP
4.0000 mg | ORAL_TABLET | Freq: Once | ORAL | Status: AC
  Administered 2023-11-12: 4 mg via ORAL
  Filled 2023-11-12: qty 1

## 2023-11-12 MED ORDER — BENZONATATE 100 MG PO CAPS: 100.0000 mg | ORAL_CAPSULE | Freq: Three times a day (TID) | ORAL | 0 refills | Status: AC | PRN

## 2023-11-12 MED ORDER — ONDANSETRON 4 MG PO TBDP: 4.0000 mg | ORAL_TABLET | Freq: Three times a day (TID) | ORAL | 0 refills | Status: AC | PRN

## 2023-11-12 MED ORDER — ACETAMINOPHEN 325 MG PO TABS
650.0000 mg | ORAL_TABLET | Freq: Once | ORAL | Status: AC | PRN
  Administered 2023-11-12: 650 mg via ORAL
  Filled 2023-11-12: qty 2

## 2023-11-12 NOTE — ED Notes (Signed)
 Pt verbalizes understanding of discharge instructions. Opportunity for questioning and answers were provided. Pt discharged from ED to home with family.

## 2023-11-12 NOTE — ED Provider Notes (Signed)
 [facility] Provider Note    Event Date/Time   First MD Initiated Contact with Patient 11/12/23 1957     (approximate)   History   Fever   HPI  Kelli Richard is a 43 y.o. female with fibromyalgia, chronic pain syndrome who comes in with concerns for fever.  Patient reports that her family member had the flu and that she was placed on prophylactic antiviral medicine 4 days ago.  She reports that today she woke up with fevers, headaches, cough.   Physical Exam   Triage Vital Signs: ED Triage Vitals [11/12/23 1804]  Encounter Vitals Group     BP (!) 136/105     Systolic BP Percentile      Diastolic BP Percentile      Pulse Rate (!) 136     Resp 18     Temp (!) 102.4 F (39.1 C)     Temp Source Oral     SpO2 97 %     Weight 237 lb (107.5 kg)     Height 5\' 7"  (1.702 m)     Head Circumference      Peak Flow      Pain Score 10     Pain Loc      Pain Education      Exclude from Growth Chart     Most recent vital signs: Vitals:   11/12/23 1804 11/12/23 2036  BP: (!) 136/105 (!) 109/58  Pulse: (!) 136 99  Resp: 18 18  Temp: (!) 102.4 F (39.1 C) 99.4 F (37.4 C)  SpO2: 97% 98%     General: Awake, no distress.  CV:  Good peripheral perfusion.  Resp:  Normal effort.  Abd:  No distention.  Soft and nontender Other:     ED Results / Procedures / Treatments   Labs (all labs ordered are listed, but only abnormal results are displayed) Labs Reviewed  RESP PANEL BY RT-PCR (RSV, FLU A&B, COVID)  RVPGX2 - Abnormal; Notable for the following components:      Result Value   Influenza A by PCR POSITIVE (*)    All other components within normal limits  GROUP A STREP BY PCR    PROCEDURES:  Critical Care performed: No  Procedures   MEDICATIONS ORDERED IN ED: Medications  acetaminophen (TYLENOL) tablet 650 mg (650 mg Oral Given 11/12/23 1807)  ketorolac (TORADOL) 30 MG/ML injection 30 mg (30 mg Intramuscular Given 11/12/23 2038)   ondansetron (ZOFRAN-ODT) disintegrating tablet 4 mg (4 mg Oral Given 11/12/23 2037)     IMPRESSION / MDM / ASSESSMENT AND PLAN / ED COURSE  I reviewed the triage vital signs and the nursing notes.   Patient's presentation is most consistent with acute presentation with potential threat to life or bodily function.   Patient comes in with fever, tachycardia.  With correction of fever heart rate has come down.  Patient was given some Toradol, Zofran to help with symptoms as well.  We discussed oral hydration no indication for IV right now given patient's heart rates are less than 100 and tolerating p.o.  Patient is requesting discharge home and she is got family members in the car waiting for her.  Will treat symptomatically.  We discussed Tamiflu which she would like to proceed with and we discussed stopping what ever prophylactic medication they had her on and taking the full dose Tamiflu now.  Patient did have blood work done on 08/26/2023 with normal creatinine  FINAL CLINICAL IMPRESSION(S) / ED DIAGNOSES   Final diagnoses:  Influenza     Rx / DC Orders   ED Discharge Orders          Ordered    oseltamivir (TAMIFLU) 75 MG capsule  2 times daily        11/12/23 2052    ondansetron (ZOFRAN-ODT) 4 MG disintegrating tablet  Every 8 hours PRN        11/12/23 2052    benzonatate (TESSALON PERLES) 100 MG capsule  3 times daily PRN        11/12/23 2052             Note:  This document was prepared using Dragon voice recognition software and may include unintentional dictation errors.   Concha Se, MD 11/12/23 2053

## 2023-11-12 NOTE — ED Provider Triage Note (Signed)
 Emergency Medicine Provider Triage Evaluation Note  Kelli Richard , a 43 y.o. female  was evaluated in triage.  Pt complains of cough fever.  Review of Systems  Positive:  Negative:  Physical Exam  BP (!) 136/105 (BP Location: Left Arm)   Pulse (!) 136   Temp (!) 102.4 F (39.1 C) (Oral)   Resp 18   Ht 5\' 7"  (1.702 m)   Wt 107.5 kg   LMP 05/07/2017 (Exact Date)   SpO2 97%   BMI 37.12 kg/m  Gen:   Awake, no distress   Resp:  Normal effort  MSK:   Moves extremities without difficulty  Other:    Medical Decision Making  Medically screening exam initiated at 6:05 PM.  Appropriate orders placed.  Kelli Richard was informed that the remainder of the evaluation will be completed by another provider, this initial triage assessment does not replace that evaluation, and the importance of remaining in the ED until their evaluation is complete.  Patient with upper respiratory infection probably viral ordered respiratory panel   Kelli Damme, PA-C 11/12/23 1806

## 2023-11-12 NOTE — Discharge Instructions (Signed)
 Take Tylenol 1 g every 8 hours with ibuprofen 600 every 8 hours with food to help with any pain, fevers.  Return to the ER if develop worsening symptoms or any other concerns  Stop taking the antiviral medication you were on previously and start this 1one.

## 2023-11-12 NOTE — ED Triage Notes (Signed)
 Patient to ED via POV fro fever, cough and sore throat. Symptoms started today. Fever 101.3 at home. Hx of fibromyalgia

## 2023-12-01 NOTE — Telephone Encounter (Signed)
 Pt has been given a call to confirm Lab test

## 2023-12-30 ENCOUNTER — Emergency Department

## 2023-12-30 ENCOUNTER — Emergency Department
Admission: EM | Admit: 2023-12-30 | Discharge: 2023-12-30 | Disposition: A | Discharge status: Home / Self Care | Attending: Emergency Medicine | Admitting: Emergency Medicine

## 2023-12-30 ENCOUNTER — Other Ambulatory Visit: Payer: Self-pay

## 2023-12-30 DIAGNOSIS — R519 Headache, unspecified: Secondary | ICD-10-CM | POA: Diagnosis present

## 2023-12-30 DIAGNOSIS — R079 Chest pain, unspecified: Secondary | ICD-10-CM | POA: Insufficient documentation

## 2023-12-30 DIAGNOSIS — G43909 Migraine, unspecified, not intractable, without status migrainosus: Secondary | ICD-10-CM

## 2023-12-30 LAB — CBC
HCT: 39.9 % (ref 36.0–46.0)
Hemoglobin: 13.1 g/dL (ref 12.0–15.0)
MCH: 31.7 pg (ref 26.0–34.0)
MCHC: 32.8 g/dL (ref 30.0–36.0)
MCV: 96.6 fL (ref 80.0–100.0)
Platelets: 251 10*3/uL (ref 150–400)
RBC: 4.13 MIL/uL (ref 3.87–5.11)
RDW: 12.2 % (ref 11.5–15.5)
WBC: 6.2 10*3/uL (ref 4.0–10.5)
nRBC: 0 % (ref 0.0–0.2)

## 2023-12-30 LAB — BASIC METABOLIC PANEL WITH GFR
Anion gap: 7 (ref 5–15)
BUN: 15 mg/dL (ref 6–20)
CO2: 26 mmol/L (ref 22–32)
Calcium: 9 mg/dL (ref 8.9–10.3)
Chloride: 104 mmol/L (ref 98–111)
Creatinine, Ser: 1.03 mg/dL — ABNORMAL HIGH (ref 0.44–1.00)
GFR, Estimated: >60 mL/min (ref >60)
Glucose, Bld: 90 mg/dL (ref 70–99)
Potassium: 3.7 mmol/L (ref 3.5–5.1)
Sodium: 137 mmol/L (ref 135–145)

## 2023-12-30 LAB — TROPONIN I (HIGH SENSITIVITY): Troponin I (High Sensitivity): 4 ng/L (ref <18)

## 2023-12-30 MED ORDER — HYDROCODONE-ACETAMINOPHEN 5-325 MG PO TABS: 1.0000 | ORAL_TABLET | ORAL | 0 refills | Status: AC | PRN

## 2023-12-30 NOTE — ED Provider Notes (Signed)
.-----------------------------------------   3:16 PM on 12/30/2023 -----------------------------------------  Blood pressure 117/68, pulse 99, temperature 98.2 F (36.8 C), resp. rate 18, height 5\' 7"  (1.702 m), weight 104.3 kg, last menstrual period 05/07/2017, SpO2 100%.  Assuming care from Dr. Azalee Bolds.  In short, Kelli Richard is a 43 y.o. female with a chief complaint of Migraine .  Refer to the original H&P for additional details.  The current plan of care is to follow-up labs, chest x-ray, likely discharge after.  Independent review of labs and imaging are below.  On reassessment patient is well-appearing, discussed imaging and lab results with her.  She has an appointment with her primary care doctor on Thursday.  Discussed with patient about following up with her primary care doctor for further management of her symptoms.  Considered but no indication for inpatient admission at this time, she is safe for outpatient management.  Will discharge with strict return precautions.  Clinical Course as of 12/30/23 1534  Tue Dec 30, 2023  1514 Signed out pending labs (single trop). H/o migraines, budd chiari. No new neuro deficits.  [TT]  1532 DG Chest Portable 1 View No active disease.  [TT]  1532 Independent review of labs, no leukocytosis, electrolytes severely deranged, troponin is negative. [TT]    Clinical Course User Index [TT] Shane Darling, MD      Shane Darling, MD 12/30/23 [phone]

## 2023-12-30 NOTE — ED Notes (Signed)
 Fall precautions in place for Pt. This RN placed fall band, fall grip socks, bed alarm and fall sign.

## 2023-12-30 NOTE — ED Provider Notes (Signed)
 [facility] Provider Note    Event Date/Time   First MD Initiated Contact with Patient 12/30/23 1408     (approximate)  History   Chief Complaint: Migraine  HPI  Kelli Richard is a 42 y.o. female with a past medical history of anemia, fibromyalgia, Chiari malformation, migraines, presents to the emergency department for a migraine headache.  According to the patient she has had worsening migraine headaches for at least 1 year now despite her home medications of Lyrica.  Patient called her neurologist yesterday they did not know what else to offer her so they told her to go to the emergency department per patient.  Patient states this feels like her chronic headache but it is not getting better.  States her medications are not working.  Physical Exam   Triage Vital Signs: ED Triage Vitals  Encounter Vitals Group     BP 12/30/23 1333 117/68     Systolic BP Percentile --      Diastolic BP Percentile --      Pulse Rate 12/30/23 1333 97     Resp 12/30/23 1333 17     Temp 12/30/23 1333 98.8 F (37.1 C)     Temp Source 12/30/23 1333 Oral     SpO2 12/30/23 1333 100 %     Weight 12/30/23 1334 230 lb (104.3 kg)     Height 12/30/23 1334 5\' 7"  (1.702 m)     Head Circumference --      Peak Flow --      Pain Score 12/30/23 1338 10     Pain Loc --      Pain Education --      Exclude from Growth Chart --     Most recent vital signs: Vitals:   12/30/23 1333 12/30/23 1338  BP: 117/68 117/68  Pulse: 97 99  Resp: 17 18  Temp: 98.8 F (37.1 C) 98.2 F (36.8 C)  SpO2: 100% 100%    General: Awake, no distress.  CV:  Good peripheral perfusion.  Regular rate and rhythm  Resp:  Normal effort.  Equal breath sounds bilaterally.  Abd:  No distention.  Soft, nontender.  No rebound or guarding.  ED Results / Procedures / Treatments   EKG  EKG viewed and interpreted by myself shows a normal sinus rhythm at 72 bpm the narrow QRS, normal axis, normal  intervals, nonspecific but no concerning ST changes.  RADIOLOGY  I have reviewed and interpreted chest x-ray images.  No consolidation seen on my evaluation. Radiology has read the chest x-ray is negative.   MEDICATIONS ORDERED IN ED: Medications - No data to display   IMPRESSION / MDM / ASSESSMENT AND PLAN / ED COURSE  I reviewed the triage vital signs and the nursing notes.  Patient's presentation is most consistent with acute presentation with potential threat to life or bodily function.  Patient presents to the emergency department for continued headache.  I reviewed the patient's chart she sees neurology at [facility].  She had an MRI performed 7 days ago that showed no acute finding.  Patient also states as a secondary complaint she has been experiencing some pain in her chest intermittent for the last few days.  We will obtain labs EKG and a chest x-ray as a precaution given the chest pain.  Regarding the patient's headache I offered to dose a migraine cocktail of medications, patient states none of those have worked for her previously.  I offered to give her  a one-time dose of pain medication in the emergency department however she states she is driving and has no one to pick up her children.  As a compromise I offered to discharge her with a very short course of pain medication.  Patient states she will follow-up with her neurologist next has follow-up with a pain management specialist coming up in a week or 2.  No concerning findings on history or exam regarding the headache.  Patient's workup is reassuring with a normal CBC normal chemistry negative troponin.  Reassuring chest x-ray and EKG.  FINAL CLINICAL IMPRESSION(S) / ED DIAGNOSES   Headache Chest pain  Note:  This document was prepared using Dragon voice recognition software and may include unintentional dictation errors.   Ruth Cove, MD 01/08/24 1147

## 2023-12-30 NOTE — Discharge Instructions (Addendum)
 Please follow-up with your primary care doctor as well as neurologist as soon as you are able.  Return to the emergency department for any return of/worsening chest pain or any other symptom personally concerning to yourself.  You have been prescribed a short course of pain medication.  Please only take this medication when needed.  Do not drink alcohol or drive while taking this medication.  Only use as prescribed.

## 2023-12-30 NOTE — ED Triage Notes (Signed)
 Patient comes in today via pov with complaints of a migraine. Pt states that she is on medication for her migraines but it has not been working. Pt spoke with her primary care provider to advised her to come be seen. Pt has a history of fibromyalgia, and Chiari malformation. Pt is alert and oriented x4 with no other signs of acute distress at this time.

## 2024-01-12 ENCOUNTER — Other Ambulatory Visit: Payer: Self-pay | Admitting: Family Medicine

## 2024-01-12 DIAGNOSIS — Z1231 Encounter for screening mammogram for malignant neoplasm of breast: Secondary | ICD-10-CM

## 2024-01-29 ENCOUNTER — Other Ambulatory Visit: Payer: Self-pay | Admitting: *Deleted

## 2024-01-29 ENCOUNTER — Inpatient Hospital Stay
Admission: RE | Admit: 2024-01-29 | Discharge: 2024-01-29 | Disposition: A | Discharge status: Home / Self Care | Payer: Self-pay | Source: Ambulatory Visit | Attending: Family Medicine | Admitting: Family Medicine

## 2024-01-29 ENCOUNTER — Ambulatory Visit
Admission: RE | Admit: 2024-01-29 | Discharge: 2024-01-29 | Disposition: A | Discharge status: Home / Self Care | Source: Ambulatory Visit | Attending: Family Medicine | Admitting: Family Medicine

## 2024-01-29 DIAGNOSIS — Z1231 Encounter for screening mammogram for malignant neoplasm of breast: Secondary | ICD-10-CM

## 2024-04-02 ENCOUNTER — Encounter: Payer: Self-pay | Admitting: Intensive Care

## 2024-04-02 ENCOUNTER — Other Ambulatory Visit: Payer: Self-pay

## 2024-04-02 ENCOUNTER — Emergency Department
Admission: EM | Admit: 2024-04-02 | Discharge: 2024-04-02 | Disposition: A | Discharge status: Home / Self Care | Attending: Emergency Medicine | Admitting: Emergency Medicine

## 2024-04-02 ENCOUNTER — Emergency Department

## 2024-04-02 DIAGNOSIS — R040 Epistaxis: Secondary | ICD-10-CM | POA: Diagnosis not present

## 2024-04-02 DIAGNOSIS — K209 Esophagitis, unspecified without bleeding: Secondary | ICD-10-CM | POA: Insufficient documentation

## 2024-04-02 DIAGNOSIS — R1030 Lower abdominal pain, unspecified: Secondary | ICD-10-CM | POA: Diagnosis present

## 2024-04-02 LAB — HEPATIC FUNCTION PANEL
ALT: 10 U/L (ref 0–44)
AST: 18 U/L (ref 15–41)
Albumin: 3.7 g/dL (ref 3.5–5.0)
Alkaline Phosphatase: 68 U/L (ref 38–126)
Bilirubin, Direct: <0.1 mg/dL (ref 0.0–0.2)
Total Bilirubin: 0.7 mg/dL (ref 0.0–1.2)
Total Protein: 7 g/dL (ref 6.5–8.1)

## 2024-04-02 LAB — BASIC METABOLIC PANEL WITH GFR
Anion gap: 8 (ref 5–15)
BUN: 12 mg/dL (ref 6–20)
CO2: 26 mmol/L (ref 22–32)
Calcium: 9.2 mg/dL (ref 8.9–10.3)
Chloride: 105 mmol/L (ref 98–111)
Creatinine, Ser: 0.81 mg/dL (ref 0.44–1.00)
GFR, Estimated: >60 mL/min (ref >60)
Glucose, Bld: 86 mg/dL (ref 70–99)
Potassium: 3.9 mmol/L (ref 3.5–5.1)
Sodium: 139 mmol/L (ref 135–145)

## 2024-04-02 LAB — TROPONIN I (HIGH SENSITIVITY): Troponin I (High Sensitivity): 3 ng/L (ref <18)

## 2024-04-02 LAB — CBC
HCT: 39.8 % (ref 36.0–46.0)
Hemoglobin: 13.1 g/dL (ref 12.0–15.0)
MCH: 31.6 pg (ref 26.0–34.0)
MCHC: 32.9 g/dL (ref 30.0–36.0)
MCV: 95.9 fL (ref 80.0–100.0)
Platelets: 257 K/uL (ref 150–400)
RBC: 4.15 MIL/uL (ref 3.87–5.11)
RDW: 13.1 % (ref 11.5–15.5)
WBC: 5.5 K/uL (ref 4.0–10.5)
nRBC: 0 % (ref 0.0–0.2)

## 2024-04-02 LAB — LIPASE, BLOOD: Lipase: 28 U/L (ref 11–51)

## 2024-04-02 MED ORDER — ONDANSETRON HCL 4 MG/2ML IJ SOLN
4.0000 mg | Freq: Once | INTRAMUSCULAR | Status: AC
  Administered 2024-04-02: 4 mg via INTRAVENOUS
  Filled 2024-04-02: qty 2

## 2024-04-02 MED ORDER — PANTOPRAZOLE SODIUM 40 MG PO TBEC: 40.0000 mg | DELAYED_RELEASE_TABLET | Freq: Every day | ORAL | 1 refills | Status: AC

## 2024-04-02 MED ORDER — PANTOPRAZOLE SODIUM 40 MG IV SOLR
40.0000 mg | Freq: Once | INTRAVENOUS | Status: AC
Start: 2024-04-02 — End: 2024-04-02
  Administered 2024-04-02: 40 mg via INTRAVENOUS
  Filled 2024-04-02: qty 10

## 2024-04-02 NOTE — ED Triage Notes (Signed)
 Patient reports this AM a nose bleed started along with chest pain, abdominal pain and back pain. Reports one episode of emesis today and worsening nausea   A&O x4 during triage. Drove self to ER  History fibromyalgia and chiari malfunction

## 2024-04-02 NOTE — ED Provider Notes (Signed)
 [facility] Provider Note    Event Date/Time   First MD Initiated Contact with Patient 04/02/24 1407     (approximate)   History   Chest Pain, Abdominal Pain, and Epistaxis   HPI  Kelli Richard is a 43 y.o. female history of fibromyalgia, anemia, Chiari malformation presents emergency department stating she had a nosebleed earlier today and then started having pain from the esophagus down to her lower abdomen.  Denies shortness of breath.  No sweating.  States felt a little weak when it happened.  Patient does have a history of GERD.  She states she wakes up with burning in the esophagus.  Nosebleed did not last but a few minutes.      Physical Exam   Triage Vital Signs: ED Triage Vitals  Encounter Vitals Group     BP 04/02/24 1302 137/78     Girls Systolic BP Percentile --      Girls Diastolic BP Percentile --      Boys Systolic BP Percentile --      Boys Diastolic BP Percentile --      Pulse Rate 04/02/24 1302 86     Resp 04/02/24 1302 16     Temp 04/02/24 1302 99.2 F (37.3 C)     Temp Source 04/02/24 1302 Oral     SpO2 04/02/24 1302 100 %     Weight 04/02/24 1303 236 lb (107 kg)     Height 04/02/24 1303 5' 7 (1.702 m)     Head Circumference --      Peak Flow --      Pain Score 04/02/24 1303 10     Pain Loc --      Pain Education --      Exclude from Growth Chart --     Most recent vital signs: Vitals:   04/02/24 1302  BP: 137/78  Pulse: 86  Resp: 16  Temp: 99.2 F (37.3 C)  SpO2: 100%     General: Awake, no distress.   CV:  Good peripheral perfusion.  Resp:  Normal effort.  Abd:  No distention.   Other:  ENT: No bleeding noted in the nasal cavity   ED Results / Procedures / Treatments   Labs (all labs ordered are listed, but only abnormal results are displayed) Labs Reviewed  BASIC METABOLIC PANEL WITH GFR  CBC  HEPATIC FUNCTION PANEL  LIPASE, BLOOD  TROPONIN I (HIGH SENSITIVITY)      EKG  EKG   RADIOLOGY Chest x-ray    PROCEDURES:   Procedures  Critical Care: No Chief Complaint  Patient presents with   Chest Pain   Abdominal Pain   Epistaxis      MEDICATIONS ORDERED IN ED: Medications  pantoprazole (PROTONIX) injection 40 mg (40 mg Intravenous Given 04/02/24 1456)  ondansetron  (ZOFRAN ) injection 4 mg (4 mg Intravenous Given 04/02/24 1454)     IMPRESSION / MDM / ASSESSMENT AND PLAN / ED COURSE  I reviewed the triage vital signs and the nursing notes.                              Differential diagnosis includes, but is not limited to, epistaxis, GERD, esophagitis, AAA, MI  Patient's presentation is most consistent with acute illness / injury with system symptoms.    Medications given: Protonix 40 mg IV, Zofran  4 mg IV  Nosebleed has resolved on its own.  Patient appears  to be very well.  Chest is tender to palpation and so his abdomen.  Feel AAA is less likely at this time as she does not have pain in the mid abdomen radiating to the back or chest pain radiating into the back.  Also vitals appear to be normal.  Some of the nosebleed may actually be from her taking a daily Zyrtec with the area becoming dried out.  Other symptoms may be related to her GERD.  Will do a trial of Protonix 40 mg IV and Zofran  40 mg IV.  Will await further labs.  CBC metabolic panel and first troponin are normal  EKG shows normal sinus rhythm, see physician read, no STEMI  Chest x-ray independently reviewed interpreted by me as being negative for any acute abnormality, no widening to indicate aneurysm   Patient is feeling better after Protonix and Zofran .  No additional epistaxis.  Feel that the epistaxis may be from her allergy medicine.  She is to apply a small amount of Vaseline into the nose at night before bed.  For the GERD symptoms I think we will go ahead and place her on Protonix to help with the symptoms.  She is to follow-up with her regular doctor.   Return emergency department worsening.  Labs have all been normal along with her EKG and chest x-ray so do not feel the patient had an MI.  She was discharged stable condition.  FINAL CLINICAL IMPRESSION(S) / ED DIAGNOSES   Final diagnoses:  Esophagitis  Anterior epistaxis     Rx / DC Orders   ED Discharge Orders          Ordered    pantoprazole (PROTONIX) 40 MG tablet  Daily        04/02/24 1606             Note:  This document was prepared using Dragon voice recognition software and may include unintentional dictation errors.    Gasper Devere ORN, PA-C 04/02/24 RUDEAN Willo Dunnings, MD 04/02/24 [phone]

## 2024-04-27 ENCOUNTER — Other Ambulatory Visit: Payer: Self-pay

## 2024-04-27 ENCOUNTER — Emergency Department

## 2024-04-27 ENCOUNTER — Emergency Department
Admission: EM | Admit: 2024-04-27 | Discharge: 2024-04-27 | Disposition: A | Discharge status: Home / Self Care | Attending: Emergency Medicine | Admitting: Emergency Medicine

## 2024-04-27 DIAGNOSIS — R52 Pain, unspecified: Secondary | ICD-10-CM

## 2024-04-27 DIAGNOSIS — R42 Dizziness and giddiness: Secondary | ICD-10-CM | POA: Diagnosis present

## 2024-04-27 LAB — BASIC METABOLIC PANEL WITH GFR
Anion gap: 8 (ref 5–15)
BUN: 12 mg/dL (ref 6–20)
CO2: 24 mmol/L (ref 22–32)
Calcium: 9 mg/dL (ref 8.9–10.3)
Chloride: 106 mmol/L (ref 98–111)
Creatinine, Ser: 0.78 mg/dL (ref 0.44–1.00)
GFR, Estimated: >60 mL/min (ref >60)
Glucose, Bld: 96 mg/dL (ref 70–99)
Potassium: 3.9 mmol/L (ref 3.5–5.1)
Sodium: 138 mmol/L (ref 135–145)

## 2024-04-27 LAB — CBC
HCT: 40.5 % (ref 36.0–46.0)
Hemoglobin: 13.4 g/dL (ref 12.0–15.0)
MCH: 31.9 pg (ref 26.0–34.0)
MCHC: 33.1 g/dL (ref 30.0–36.0)
MCV: 96.4 fL (ref 80.0–100.0)
Platelets: 245 K/uL (ref 150–400)
RBC: 4.2 MIL/uL (ref 3.87–5.11)
RDW: 13.1 % (ref 11.5–15.5)
WBC: 6.6 K/uL (ref 4.0–10.5)
nRBC: 0 % (ref 0.0–0.2)

## 2024-04-27 LAB — TROPONIN I (HIGH SENSITIVITY): Troponin I (High Sensitivity): 3 ng/L (ref <18)

## 2024-04-27 LAB — HEPATIC FUNCTION PANEL
ALT: 13 U/L (ref 0–44)
AST: 20 U/L (ref 15–41)
Albumin: 3.8 g/dL (ref 3.5–5.0)
Alkaline Phosphatase: 61 U/L (ref 38–126)
Bilirubin, Direct: 0.2 mg/dL (ref 0.0–0.2)
Indirect Bilirubin: 0.6 mg/dL (ref 0.3–0.9)
Total Bilirubin: 0.8 mg/dL (ref 0.0–1.2)
Total Protein: 7.3 g/dL (ref 6.5–8.1)

## 2024-04-27 MED ORDER — KETOROLAC TROMETHAMINE 15 MG/ML IJ SOLN
15.0000 mg | Freq: Once | INTRAMUSCULAR | Status: AC
  Administered 2024-04-27: 15 mg via INTRAVENOUS
  Filled 2024-04-27: qty 1

## 2024-04-27 MED ORDER — DIPHENHYDRAMINE HCL 50 MG/ML IJ SOLN
12.5000 mg | Freq: Once | INTRAMUSCULAR | Status: AC
  Administered 2024-04-27: 12.5 mg via INTRAVENOUS
  Filled 2024-04-27: qty 1

## 2024-04-27 MED ORDER — METOCLOPRAMIDE HCL 5 MG/ML IJ SOLN
10.0000 mg | Freq: Once | INTRAMUSCULAR | Status: AC
  Administered 2024-04-27: 10 mg via INTRAVENOUS
  Filled 2024-04-27: qty 2

## 2024-04-27 MED ORDER — SODIUM CHLORIDE 0.9 % IV BOLUS
1000.0000 mL | Freq: Once | INTRAVENOUS | Status: AC
  Administered 2024-04-27: 1000 mL via INTRAVENOUS

## 2024-04-27 NOTE — Discharge Instructions (Signed)
 Your blood work and chest x-ray, ultrasound of your left lower leg and CT scan of your head were all normal today.  Please schedule follow-up appointment with your pain clinic.  Return to the emergency department with any worsening symptoms.

## 2024-04-27 NOTE — ED Provider Notes (Signed)
 [facility] Provider Note    Event Date/Time   First MD Initiated Contact with Patient 04/27/24 1606     (approximate)   History   Chest Pain   HPI  Jalesia KANIA REGNIER is a 43 y.o. female with PMH of Chiari malformation 1, fibromyalgia, migraines presents for evaluation of pain throughout her body.  Patient noticed that yesterday her left foot and right hand were swollen when compared with the other extremity.  She also noticed some redness on the bottom of her left foot.  She began to have pain throughout her entire body at this time as well.  She reports headache, chest pain, back pain, shortness of breath and pain in her legs and feet.      Physical Exam   Triage Vital Signs: ED Triage Vitals  Encounter Vitals Group     BP 04/27/24 1532 109/75     Girls Systolic BP Percentile --      Girls Diastolic BP Percentile --      Boys Systolic BP Percentile --      Boys Diastolic BP Percentile --      Pulse Rate 04/27/24 1532 85     Resp 04/27/24 1532 18     Temp 04/27/24 1532 98.3 F (36.8 C)     Temp Source 04/27/24 1532 Oral     SpO2 04/27/24 1532 96 %     Weight 04/27/24 1532 228 lb (103.4 kg)     Height 04/27/24 1532 5' 7 (1.702 m)     Head Circumference --      Peak Flow --      Pain Score 04/27/24 1538 10     Pain Loc --      Pain Education --      Exclude from Growth Chart --     Most recent vital signs: Vitals:   04/27/24 1630 04/27/24 1831  BP:    Pulse:    Resp:    Temp:    SpO2: 96% 96%   General: Awake, no distress.  CV:  Good peripheral perfusion. RRR. Resp:  Normal effort. CTAB. Abd:  No distention.  Other:  PERRL, EOM intact, no focal neuro deficits, no ataxia, no pronator drift, dorsalis pedis pulses are 2+ and regular bilaterally, hands do not appear swollen when compared side to side, left foot is mildly swollen when compared to the right, does have some redness on the bottom of the left foot but no warmth, left foot  is tender to palpation, capillary refill is appropriate in both feet   ED Results / Procedures / Treatments   Labs (all labs ordered are listed, but only abnormal results are displayed) Labs Reviewed  BASIC METABOLIC PANEL WITH GFR  CBC  HEPATIC FUNCTION PANEL  TROPONIN I (HIGH SENSITIVITY)  TROPONIN I (HIGH SENSITIVITY)     EKG  ED Provider interpretation: Normal sinus rhythm  Vent. rate 79 BPM PR interval 164 ms QRS duration 72 ms QT/QTcB 350/401 ms P-R-T axes 66 103 31   RADIOLOGY  Chest x-ray obtained, I interpreted the images as well as reviewed the radiologist report, which was negative for any acute cardiopulmonary abnormalities.   PROCEDURES:  Critical Care performed: No  Procedures   MEDICATIONS ORDERED IN ED: Medications  sodium chloride  0.9 % bolus 1,000 mL (1,000 mLs Intravenous New Bag/Given 04/27/24 1733)  ketorolac  (TORADOL ) 15 MG/ML injection 15 mg (15 mg Intravenous Given 04/27/24 1734)  metoCLOPramide  (REGLAN ) injection 10 mg (10 mg Intravenous  Given 04/27/24 1735)  diphenhydrAMINE  (BENADRYL ) injection 12.5 mg (12.5 mg Intravenous Given 04/27/24 1734)     IMPRESSION / MDM / ASSESSMENT AND PLAN / ED COURSE  I reviewed the triage vital signs and the nursing notes.                             62 female presents for evaluation of pain and swelling throughout her body.  Vital signs are stable patient NAD on exam.  Differential diagnosis includes, but is not limited to, acute on chronic pain, migraine, fibromyalgia flare, migraine, tension headache, DVT, lymphedema.  Less likely ACS.  Patient's presentation is most consistent with acute complicated illness / injury requiring diagnostic workup.  CBC, BMP and hepatic function panel are unremarkable.  Troponin is not elevated.  Chest x-ray is negative.  Patient does have some mild swelling in the left lower extremity and foot, will obtain ultrasound to rule out DVT.  Does have good pulses in both  feet.  Given patient's report of worsening headaches and history of Chiari malformation will obtain CT scan for further evaluation.  Patient's initial complaint was chest pain.  Per my conversation with her it appears she is having pain throughout her body.  Very low suspicion for PE and ACS given its more generalized pain.  Troponin is negative and EKG shows normal sinus rhythm without ST changes.  Cannot use PERC rule to rule out pulmonary embolism as patient does have unilateral leg swelling but do not feel her description of her symptoms matches that of a PE.  Patient was given a migraine cocktail to address her headache and reports significant improvement in her pain after receiving these medications.  I believe her generalized pain is a result of a flare of her fibromyalgia.  Advised patient to schedule follow-up appointment with her pain clinic who manages this.  Patient voiced understanding, all questions were answered and she was stable at discharge.  Clinical Course as of 04/27/24 1857  Tue Apr 27, 2024  1755 Within normal limits. [LD]  1824 US  Venous Img Lower  Left (DVT Study) Negative for DVT [LD]  1824 CT Head Wo Contrast Negative. [LD]    Clinical Course User Index [LD] Cleaster Tinnie LABOR, PA-C     FINAL CLINICAL IMPRESSION(S) / ED DIAGNOSES   Final diagnoses:  Generalized pain     Rx / DC Orders   ED Discharge Orders     None        Note:  This document was prepared using Dragon voice recognition software and may include unintentional dictation errors.   Cleaster Tinnie LABOR, PA-C 04/27/24 1857    Willo Dunnings, MD 04/28/24 [phone]

## 2024-04-27 NOTE — ED Triage Notes (Signed)
 Pt comes in via pov with complaints of chest pain and SOB that started yesterday. Pt with swelling to her left and hand and legs, and mild swelling in the right hand, leg and feet as well. Pt complains of pain 10/10 in her head, chest back and legs. Pt ambulatory in triage with cane.

## 2024-04-28 ENCOUNTER — Ambulatory Visit: Payer: Self-pay

## 2024-05-20 ENCOUNTER — Ambulatory Visit

## 2024-05-25 ENCOUNTER — Ambulatory Visit: Admitting: Family Medicine

## 2024-05-25 ENCOUNTER — Other Ambulatory Visit: Payer: Self-pay

## 2024-05-25 DIAGNOSIS — Z113 Encounter for screening for infections with a predominantly sexual mode of transmission: Secondary | ICD-10-CM

## 2024-05-25 DIAGNOSIS — B9689 Other specified bacterial agents as the cause of diseases classified elsewhere: Secondary | ICD-10-CM

## 2024-05-25 LAB — WET PREP FOR TRICH, YEAST, CLUE
Clue Cell Exam: POSITIVE — AB
Trichomonas Exam: NEGATIVE
Yeast Exam: NEGATIVE

## 2024-05-25 LAB — HM HEPATITIS C SCREENING LAB: HM Hepatitis Screen: NEGATIVE

## 2024-05-25 LAB — HEPATITIS B SURFACE ANTIGEN

## 2024-05-25 LAB — HM HIV SCREENING LAB: HM HIV Screening: NEGATIVE

## 2024-05-25 MED ORDER — METRONIDAZOLE 500 MG PO TABS: 500.0000 mg | ORAL_TABLET | Freq: Two times a day (BID) | ORAL | Status: AC
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
## 2024-05-25 NOTE — Patient Instructions (Signed)
 STI screening - Today we obtained a vaginal swab to screen for gonorrhea, chlamydia, and trichomonas, oral swab to screen for gonorrhea, and anal swab to screen for gonorrhea - We also obtained a blood sample to screen for HIV, syphilis, Hepatitis B, and Hepatitis C  - If the results are abnormal, I will give you a call.    Estimated time frame for results collected at the [facility] Department: Same day Trichomonas Yeast BV (bacterial vaginosis)  Within 1-2 weeks Gonorrhea Chlamydia  Within 1-2 weeks HIV Syphilis Hepatitis B Hepatitis C

## 2024-05-25 NOTE — Progress Notes (Signed)
 Pt is here for STD screening. Wet prep results reviewed with patient and was dispensed metronidazole  500 mg tablets 2x/day for 7 days.Condoms and Brochure declined. Kwadwo Mindy Behnken,RN.

## 2024-05-30 LAB — GONOCOCCUS CULTURE

## 2024-06-01 DIAGNOSIS — B9689 Other specified bacterial agents as the cause of diseases classified elsewhere: Secondary | ICD-10-CM | POA: Insufficient documentation

## 2024-06-06 NOTE — Progress Notes (Signed)
Chart reviewed by Pharmacist  Suzanne Walker PharmD, Contract Pharmacist at [facility]  

## 2024-08-03 ENCOUNTER — Other Ambulatory Visit: Payer: Self-pay | Admitting: Physician Assistant

## 2024-08-03 ENCOUNTER — Ambulatory Visit
Admission: RE | Admit: 2024-08-03 | Discharge: 2024-08-03 | Disposition: A | Source: Ambulatory Visit | Attending: Physician Assistant | Admitting: Physician Assistant

## 2024-08-03 DIAGNOSIS — R053 Chronic cough: Secondary | ICD-10-CM

## 2024-09-06 ENCOUNTER — Ambulatory Visit: Payer: Self-pay

## 2024-09-06 NOTE — Telephone Encounter (Signed)
 FYI Only or Action Required?: FYI only for provider: ED advised.  Patient was last seen in primary care on new PT.  Called Nurse Triage reporting Shortness of Breath and Chest Pain.  Symptoms began a week ago.  Interventions attempted: Prescription medications: inhalers and Rest, hydration, or home remedies.  Symptoms are: gradually worsening.  Triage Disposition: Go to ED Now (Notify PCP)  Patient/caregiver understands and will follow disposition?: Yes  Copied from CRM [id number]. Topic: Clinical - Red Word Triage >> Sep 06, 2024  1:45 PM Leila BROCKS wrote: Red Word that prompted transfer to Nurse Triage: Patient [phone] states having chest pain and breathing- shortness of breathing, unsure of wheezing, and dizziness, no fever. Unable to schedule NPT referred by PA, Jones for Subacute cough. Please advise. Reason for Disposition  [1] MODERATE difficulty breathing (e.g., speaks in phrases, SOB even at rest, pulse 100-120) AND [2] NEW-onset or WORSE than normal  Answer Assessment - Initial Assessment Questions Patient calling to schedule new patient with Pulm for subacute cough. Pt has been having SOB, CP, silent wheezing and dizziness.   Patient reports that CP and SOB are at rest- has cough but present even without coughing. Feels like she is choking on air when she lays down. Worsened for the last week but has been dealing with for the last year off and on. She can feel the wheezing in her chest but is not audible.   Advised Current CP and SOB needs to be seen in the ED for evaluation. She agrees to go but is a single mom and has to ensure she has care for her children prior to. Advised closest ED ASAP. She will call back after evaluation.   1. RESPIRATORY STATUS: Describe your breathing? (e.g., wheezing, shortness of breath, unable to speak, severe coughing)      SOB, CP, choking when laying down  2. ONSET: When did this breathing problem begin?      The last week  3.  PATTERN Does the difficult breathing come and go, or has it been constant since it started?      Worse when laying down but constant 4. SEVERITY: How bad is your breathing? (e.g., mild, moderate, severe)      SOB at rest 5. RECURRENT SYMPTOM: Have you had difficulty breathing before? If Yes, ask: When was the last time? and What happened that time?      Issues over the last year 6. CARDIAC HISTORY: Do you have any history of heart disease? (e.g., heart attack, angina, bypass surgery, angioplasty)      Chiari malformation 7. LUNG HISTORY: Do you have any history of lung disease?  (e.g., pulmonary embolus, asthma, emphysema)     denies 8. CAUSE: What do you think is causing the breathing problem?      unsure 9. OTHER SYMPTOMS: Do you have any other symptoms? (e.g., chest pain, cough, dizziness, fever, runny nose)     Cough, dizziness 10. O2 SATURATION MONITOR:  Do you use an oxygen saturation monitor (pulse oximeter) at home? If Yes, ask: What is your reading (oxygen level) today? What is your usual oxygen saturation reading? (e.g., 95%)       Denies pulse ox 11. PREGNANCY: Is there any chance you are pregnant? When was your last menstrual period?       Denies- hysterectomy  Protocols used: Breathing Difficulty-A-AH

## 2024-09-07 ENCOUNTER — Other Ambulatory Visit: Payer: Self-pay

## 2024-09-07 ENCOUNTER — Emergency Department
Admission: EM | Admit: 2024-09-07 | Discharge: 2024-09-07 | Disposition: A | Discharge status: Home / Self Care | Attending: Emergency Medicine | Admitting: Emergency Medicine

## 2024-09-07 ENCOUNTER — Emergency Department

## 2024-09-07 ENCOUNTER — Ambulatory Visit: Payer: Self-pay

## 2024-09-07 DIAGNOSIS — R0602 Shortness of breath: Secondary | ICD-10-CM | POA: Diagnosis present

## 2024-09-07 LAB — BASIC METABOLIC PANEL WITH GFR
Anion gap: 9 (ref 5–15)
BUN: 11 mg/dL (ref 6–20)
CO2: 25 mmol/L (ref 22–32)
Calcium: 9.4 mg/dL (ref 8.9–10.3)
Chloride: 104 mmol/L (ref 98–111)
Creatinine, Ser: 0.84 mg/dL (ref 0.44–1.00)
GFR, Estimated: >60 mL/min
Glucose, Bld: 85 mg/dL (ref 70–99)
Potassium: 4.2 mmol/L (ref 3.5–5.1)
Sodium: 138 mmol/L (ref 135–145)

## 2024-09-07 LAB — TROPONIN T, HIGH SENSITIVITY: Troponin T High Sensitivity: <15 ng/L (ref 0–19)

## 2024-09-07 LAB — CBC
HCT: 40.4 % (ref 36.0–46.0)
Hemoglobin: 13.1 g/dL (ref 12.0–15.0)
MCH: 32.1 pg (ref 26.0–34.0)
MCHC: 32.4 g/dL (ref 30.0–36.0)
MCV: 99 fL (ref 80.0–100.0)
Platelets: 259 K/uL (ref 150–400)
RBC: 4.08 MIL/uL (ref 3.87–5.11)
RDW: 12.4 % (ref 11.5–15.5)
WBC: 7.3 K/uL (ref 4.0–10.5)
nRBC: 0 % (ref 0.0–0.2)

## 2024-09-07 LAB — D-DIMER, QUANTITATIVE: D-Dimer, Quant: <0.27 ug{FEU}/mL (ref 0.00–0.50)

## 2024-09-07 NOTE — Discharge Instructions (Addendum)
 You were chest x-ray, labs, and EKG are normal.  Please follow-up with your outpatient provider.  Please return for any new, worsening, or changing symptoms or other concerns.  It was a pleasure caring for you today.
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
## 2024-09-07 NOTE — ED Triage Notes (Signed)
 Pt to ED via POV from home. Pt reports CP, cough and CP since 12/3. Pt reports radiates from chest to upper ribs and lower abd.

## 2024-09-07 NOTE — ED Notes (Signed)
 See triage note  Presents with some chest pain/epigastric pain which radiates into back and abd .  States this started 12/3  and has been constant

## 2024-09-07 NOTE — Telephone Encounter (Signed)
 FYI Only or Action Required?: FYI only for provider: appointment scheduled on 09/08/24.  Patient was last seen in primary care on: new patient.  Called Nurse Triage reporting Chest Pain.  Symptoms began about a month ago.  Interventions attempted: Prescription medications: zofran ; nerve medications and Rest, hydration, or home remedies.  Symptoms are: unchanged.  Triage Disposition: See Physician Within 24 Hours  Patient/caregiver understands and will follow disposition?: Yes   Copied from CRM #[id number]. Topic: Clinical - Red Word Triage >> Sep 07, 2024  4:08 PM Leila C wrote: Red Word that prompted transfer to Nurse Triage: Patient would like to schedule an appointment. Patient went to the ER [facility] today as advised with nurse triage yesterday 09/06/24. Patient states  still having chest pain and shortness of breath. Please advise. >> Sep 07, 2024  4:15 PM Chantha C wrote: Patient states cannot hold any longer and wants NT to call patient back. Please advise and call back [phone].    Reason for Disposition  [1] Chest pain lasts > 5 minutes AND [2] occurred > 3 days ago (72 hours) AND [3] NO chest pain or cardiac symptoms now  Answer Assessment - Initial Assessment Questions Returned call to pt to f/u on symptoms. Pt reports chest pain with associated SOB since 08/04/24. PT did go to ED today and received cardiac workup, pt discharged and told to establish care with PCP asap. Pt lives in [place] but willing to travel as she has an appt in [place] tomorrow. Pt states that she feels pain is related to her fibromyalgia but is unsure. Pt is taking two nerve medications, unsure of name but will bring med list tomorrow. Pt states she was given zofran  but has not been effective with tx symptoms. Pt is not currently in distress, denies any heart racing, no gasping for air. Appointment scheduled for evaluation. Patient agrees with plan of care, and will call back if anything  changes, or if symptoms worsen.     1. LOCATION: Where does it hurt?       Chest tightness with SOB   2. RADIATION: Does the pain go anywhere else? (e.g., into neck, jaw, arms, back)     No   3. ONSET: When did the chest pain begin? (Minutes, hours or days)      08/04/24  4. PATTERN: Does the pain come and go, or has it been constant since it started?  Does it get worse with exertion?      Comes and goes   5. DURATION: How long does it last (e.g., seconds, minutes, hours)     Unsure; states that it fluctuates, has been present for 1 month   6. SEVERITY: How bad is the pain?  (e.g., Scale 1-10; mild, moderate, or severe)     9  7. CARDIAC RISK FACTORS: Do you have any history of heart problems or risk factors for heart disease? (e.g., angina, prior heart attack; diabetes, high blood pressure, high cholesterol, smoker, or strong family history of heart disease)     No; pt went to ED today and cardiac workup negative   8. PULMONARY RISK FACTORS: Do you have any history of lung disease?  (e.g., blood clots in lung, asthma, emphysema, birth control pills)     No   9. CAUSE: What do you think is causing the chest pain?     Unsure; suspect fibromyalgia   10. OTHER SYMPTOMS: Do you have any other symptoms? (e.g., dizziness, nausea, vomiting, sweating, fever, difficulty  breathing, cough)       SOB  Protocols used: Chest Pain-A-AH

## 2024-09-08 ENCOUNTER — Ambulatory Visit

## 2024-09-08 NOTE — Progress Notes (Signed)
 [facility] Established Patient Visit   Referring Doctor: Joshua Clayborne Sharps, PA-C Primary Doctor: No primary care provider on file. Date of Service: 09/08/2024  Being seen by Sherlean New, PA-C today in [facility]. _______________________________________________________________________________________________________  Chief Complaint  Patient presents with   Follow-up   Body pain   _______________________________________________________________________________________________________  History of Present Illness: Kelli Richard is a 44 y.o. female.  Patient denies any major changes in their health since the last visit.   Pain Description: Since the last visit the pain is unchanged. The most significant location of pain is the whole body. The 1-2 words that best describe the pain: sharp, stabbing, shooting, throbbing, aching, burning, stinging, electric, tingling, and constant The pain improves with nothing. The pain is made worse with most movements and general daily activities. The average daily pain score is a 10/10. The pain can be as high as a 10/10 at its worst.  The pain interferes with: All activities.   Therapies: Did the patient have an injection/procedure since the last visit? No              Has the patient had any new imaging (X-ray, MRI, CT) for pain concerns since the last visit? No   Has physical therapy been initiated or completed for this pain concern? No   Medications: Was a new medication for pain started by our clinic at the last visit? Yes: Tizanidine 2-4 mg QHS PRN             - Is the new medication useful for pain? No             - Has the new medication caused any significant side effects? Yes: Increased pain    Is the patient on a blood thinner (including aspirin, Goody/BC/Bayer powder)? Yes: Aspirin 81  mg _______________________________________________________________________________________________________  Screenings:    Behavioral Health Screening  Patient Health Questionnaire-2 Score: 0 (09/08/2024 10:21 AM)      Patient's Depression screening is Negative    Depression Plan: Normal/Negative Screening  _______________________________________________________________________________________________________  Historical Information:  Medical History[1]   Surgical History[2]  Family History[3]  Social History   Tobacco Use   Smoking status: Former    Types: Cigarettes   Smokeless tobacco: Never  Substance Use Topics   Alcohol use: Not on file   Reviewed: Yes _______________________________________________________________________________________________________  Allergies: Tree and shrub pollen  Current Medications[4] _______________________________________________________________________________________________________  Review of Systems: Reviewed by Sherlean Jenkins New, PA-C ROS was performed with positive/negative pertinent findings listed in HPI.  _______________________________________________________________________________________________________  Physical Exam:  Vitals:   09/08/24 1015  BP: 100/60  BP Location: Left arm  Patient Position: Sitting  Pulse: 84  Resp: 20  Temp: 97.6 F (36.4 C)  TempSrc: Temporal  SpO2: 95%  Weight: 102 kg (224 lb)   Pain Assessment Pain Score  : 10  What number best describes your pain on average in the past week?: 8 What number best describes how, during the past week, pain has interfered with your enjoyment of life?: 10 - Completely interferes What number best describes how, during the past week, pain has interfered with your general activity?: 10 - Completely interferes PEG Pain Total Score: 9.33  General: Constitutional: Well developed, Well nourished, Obese, No acute distress and Interactive General:  Patient is alert and orientated Psychological: The patient's mood is normal, and appropriate for the circumstances  Pain (Neuro/Musculoskeletal): Gait:   Normal. Uses assist device - carrying a  quad cane, but not using this device to ambulate Motor:  Moving extremities against gravity.  _______________________________________________________________________________________________________ CTE Measures: Is the patient taking prescription opioids? No _______________________________________________________________________________________________________ Imaging and Diagnostic Studies:  All applicable diagnostic studies related to this consultation have been reviewed. Relevant diagnostic reports and/or my personal review of imaging or other diagnostic studies listed below:   EXAM: MRI LUMBAR SPINE WITHOUT CONTRAST on 04/30/2022  TECHNIQUE:  Multiplanar, multisequence MR imaging of the lumbar spine was  performed. No intravenous contrast was administered.   COMPARISON:  Lumbar spine radiographs 07/08/2019   FINDINGS:  Segmentation: Standard.   Alignment:  Normal.   Vertebrae: No fracture, suspicious marrow lesion, or significant  marrow edema.   Conus medullaris and cauda equina: Conus extends to the L1 level.  Conus and cauda equina appear normal.   Paraspinal and other soft tissues: Unremarkable.   Disc levels:   T12-L1: Minimal disc bulging without stenosis.   L1-2: Negative.   L2-3: Shallow left foraminal disc protrusion without stenosis.   L3-4: Shallow left foraminal disc protrusion without stenosis.   L4-5: Mild disc bulging and a shallow left foraminal disc protrusion  result in mild left neural foraminal stenosis without spinal  stenosis.   L5-S1: Negative.   IMPRESSION:  1. Mild lumbar spondylosis without spinal stenosis.  2. Shallow left foraminal disc protrusions at L2-3, L3-4, and L4-5.  Mild left neural foraminal stenosis at L4-5.   Electronically  Signed    By: Dasie Hamburg M.D.    On: 04/30/2022 17:53    EXAM: MRI CERVICAL SPINE WITHOUT CONTRAST on 11/01/2022  TECHNIQUE:  Multiplanar, multisequence MR imaging of the cervical spine was  performed. No intravenous contrast was administered.   COMPARISON:  Prior MRI from 05/10/2014.   FINDINGS:  Alignment: Straightening with slight reversal of the normal cervical  lordosis. Trace anterolisthesis of C2 on C3.   Vertebrae: Vertebral body height maintained without acute or chronic  fracture. Bone marrow signal intensity within normal limits. No  worrisome osseous lesions. Mild discogenic reactive endplate change  with marrow edema present about the C6-7 interspace. No other  abnormal marrow edema.   Cord: Normal signal morphology.  No syrinx.   Posterior Fossa, vertebral arteries, paraspinal tissues: Chiari 1  malformation with the cerebellar tonsils extending up to 8 mm below  the foramen magnum. No more than mild crowding at the craniocervical  junction. Paraspinous soft tissues within normal limits. Normal flow  voids seen within the vertebral arteries bilaterally. 7 mm right  thyroid  nodule noted, of doubtful significance given size and  patient age, no follow-up imaging recommended (ref: J Am Coll  Radiol. 2015 Feb;12(2): 143-50).   Disc levels:   C2-C3: Trace anterolisthesis without significant disc bulge. No  canal or foraminal stenosis.   C3-C4: Mild disc bulge with uncovertebral spurring. No spinal  stenosis. Foramina remain patent.   C4-C5: Minimal disc bulge with uncovertebral spurring. No spinal  stenosis. Foramina remain patent.   C5-C6: Minimal disc bulge with uncovertebral spurring. No canal or  foraminal stenosis.   C6-C7: Degenerative intervertebral disc space narrowing. Broad-based  posterior disc bulge flattens the ventral thecal sac, slightly  asymmetric to the right. Mild spinal stenosis without frank cord  impingement. Mild right-sided  uncovertebral spurring without  significant foraminal encroachment.   C7-T1: Small right paracentral disc protrusion minimally flattens  the ventral thecal sac. No spinal stenosis. Foramina remain patent.   IMPRESSION:  1. Broad-based posterior disc bulge at C6-7 with resultant mild  spinal stenosis.  2. Additional mild noncompressive disc bulging at C3-4 through C5-6  without significant stenosis or neural impingement.  3. Chiari 1 malformation with the cerebellar tonsils extending up to  8 mm below the foramen magnum. No syrinx.   Electronically Signed    By: Morene Hoard M.D.    On: 11/02/2022 06:12    _______________________________________________________________________________________________________  Relevant Labs: No results found for: CREATININE No results found for: WBC, HGB, HCT, MCV, PLT No results found for: HGBA1C  _______________________________________________________________________________________________________ Diagnosis/Impression:   Fibromyalgia  Other orders -     methocarbamoL  (ROBAXIN ) 500 mg tablet; Take 1 tablet (500 mg total) by mouth 3 (three) times a day as needed for muscle spasms.    A/P 09/08/2024: Kelli Richard is a 44 y.o. female who presents to the clinic for continued management of chronic pain.  She has a PMH significant for obesity, anxiety, depression, migraines, chiari malformation, and fibromyalgia.  Today, she presents to the clinic with reports of continued and overall unchanged pain since her initial consult with Dr. Catherene on 07/15/24.  At that time she was initiated on tizanidine 2-4 mg at bedtime as needed.  She does report taking this medication, but denies any alleviation of her symptoms with initiation of tizanidine and also reports multiple adverse effects.  Currently, she reports continued diffuse and whole body pain.  She rates her average pain score as a 10/10.  She states that she has  previously taken Gabapentin , Cymbalta, and Celebrex  without any alleviation of her symptoms.  She continues to take Lyrica as prescribed by an outside provider, as well as over-the-counter Tylenol  and Ibuprofen  as needed.  At this time, I recommended proceeding with a muscle relaxant rotation from Tizanidine 4 mg at bedtime as needed to Robaxin  500 mg three times daily as needed, in hopes of achieving some alleviation of her symptoms without any adverse effects.  This medication was discussed with the patient, she was provided with an educational handout, she wished to proceed with initiation and a new prescription was sent to the pharmacy today.  We also discussed the possibility of initiation of LDN, but she states that she is unable to initiate this medication due to financial restraints.  In the future, depending on her response to Robaxin , may consider additional muscle relaxer rotation or consider switching to a TCA.  She will plan to follow-up in 6-8 weeks, after making medication changes as noted above.  In the meantime, she was encouraged to continue conservative measures including PT and HEP.  Could also consider placing referral to cognitive behavioral therapy at [facility], which was discussed at her initial consult with Dr. Catherene.  Otherwise, she will plan follow-up with PCP and other specialist as scheduled/needed in the interim.  Recommended Plan of Care: - Interventional Procedures: - None - Medical Modalities:  - Discontinue: Tizanidine 2-4 mg at bedtime as needed - Patient reports AE  - Start: Robaxin  500 mg three times daily as needed - New Rx with 1 refill sent to the pharmacy today  - Continue other current medications, including Lyrica, as scheduled/needed  - Continue: OTC Tylenol  PRN - Do not exceed 3,000 mg daily  Discussed medication risks, benefits, alternatives, and side effects of medication at length. Educational sheets provided and reviewed. All of patient's questions and concerns  addressed. - Patient reports financial restraints limiting initiation of LDN - Patient reports previously taking Gabapentin , Cymbalta, and Celebrex  without any symptom alleviation - Imaging/Labs:  - None -  Consults/Therapy:  - Consider: Referral for CBT through [facility]  - Continue: PT and HEP - Follow-up with PCP and Neurosurgery as scheduled/needed - Follow up: - Return for follow-up in 6-8 weeks after medication changes.  Treatment plan fully discussed and agreed upon with patient. All questions were answered.  Medical decision making for this patient was moderately complex given the independent interpretation of imaging and lab results, review of relevant records from other healthcare providers, the severity/progression of their condition, and the risk of complication and/or morbidity that may exist with or without treatment.  Time was spent on reviewing patient PMH, medications, procedures and image, performing medically appropriate examination, and counseling and educating patient.  Documentation on day of service.  Electronically signed by: Sherlean Jenkins New, PA-C 09/08/2024 10:58 AM       [1] History reviewed. No pertinent past medical history. [2] History reviewed. No pertinent surgical history. [3] No family history on file. [4] Current Outpatient Medications  Medication Sig Dispense Refill   acetaminophen  (TYLENOL ) 500 mg tablet Take 500 mg by mouth every 6 (six) hours as needed.     albuterol HFA (PROVENTIL HFA;VENTOLIN HFA;PROAIR HFA) 90 mcg/actuation inhaler Inhale 2 puffs every 4 (four) hours as needed.     ASPIRIN LOW-STRENGTH ORAL Take 81 mg by mouth daily.     budesonide-formoteroL (SYMBICORT;BREYNA) 160-4.5 mcg/actuation inhaler Inhale 2 puffs as needed.     calcium carbonate (OS-CAL) 1500 mg (600 mg calcium) tablet Take 1 tablet by mouth. with a meal     cetirizine (ZyrTEC) 10 mg tablet Take 10 mg by mouth daily.     cholecalciferol 25 mcg (1,000 unit)  cap Take 1,000 Units by mouth daily.     cholecalciferol, vitamin D3, (VITAMIN D3 ORAL) Take 1 tablet by mouth daily.     cyanocobalamin (VITAMIN B12) 1,000 mcg subl SL tablet Place 1,000 mcg under the tongue daily. place by sublingual route     ibuprofen  (MOTRIN ) 600 mg tablet Take 600 mg by mouth.     ondansetron  (ZOFRAN -ODT) 4 mg disintegrating tablet Dissolve 4 mg on tongue every 8 (eight) hours as needed.     pantoprazole  (PROTONIX ) 40 mg EC tablet Take 40 mg by mouth daily.     pregabalin (LYRICA) 100 mg capsule Take 100 mg by mouth daily.     promethazine (PHENERGAN) 12.5 mg tablet Take 12.5 mg by mouth every 6 (six) hours as needed.     methocarbamoL  (ROBAXIN ) 500 mg tablet Take 1 tablet (500 mg total) by mouth 3 (three) times a day as needed for muscle spasms. 90 tablet 1   No current facility-administered medications for this visit.

## 2024-09-08 NOTE — Telephone Encounter (Signed)
 [facility]  [facility]  Established Patient Screening Note  Kelli Richard is a 44 y.o. old female presenting for return patient visit.  Pain Description: Since the last visit the pain is unchanged. The most significant location of pain is the whole  body. The 1-2 words that best describe the pain: sharp, stabbing, shooting, throbbing, aching, burning, stinging, electric, tingling, and constant The pain improves with nothing. The pain is made worse with most movements and general daily activities. The average daily pain score is 10/10. The pain can be as high as 10/10 at its worst. The pain interferes with: All activities.  Therapies: Did the patient have an injection/procedure since the last visit? No   Has the patient had any new imaging (X-ray, MRI, CT) for pain concerns since the last visit? No  Has physical therapy been initiated or completed for this pain concern? No   Medications: Was a new medication for pain started by our clinic at the last visit? Yes  - Is the new medication useful for pain? No  - Has the new medication caused any significant side effects? No  Is the patient on a blood thinner (including aspirin, Goody/BC/Bayer powder)? Yes  - Name of anticoagulant/blood thinner: Aspirin 81mg 

## 2024-10-18 ENCOUNTER — Ambulatory Visit: Admitting: Student in an Organized Health Care Education/Training Program
